# Patient Record
Sex: Female | Born: 1965 | Race: White | Hispanic: No | Marital: Married | Smoking: Never smoker
Health system: Southern US, Community
[De-identification: ages and names within clinical notes are randomized; demographics above are authoritative.]

## PROBLEM LIST (undated history)

## (undated) DIAGNOSIS — Z9289 Personal history of other medical treatment: Secondary | ICD-10-CM

## (undated) DIAGNOSIS — Q652 Congenital dislocation of hip, unspecified: Secondary | ICD-10-CM

## (undated) DIAGNOSIS — F32A Depression, unspecified: Secondary | ICD-10-CM

## (undated) DIAGNOSIS — G5600 Carpal tunnel syndrome, unspecified upper limb: Secondary | ICD-10-CM

## (undated) DIAGNOSIS — M810 Age-related osteoporosis without current pathological fracture: Secondary | ICD-10-CM

## (undated) DIAGNOSIS — T8859XA Other complications of anesthesia, initial encounter: Secondary | ICD-10-CM

## (undated) DIAGNOSIS — M549 Dorsalgia, unspecified: Secondary | ICD-10-CM

## (undated) DIAGNOSIS — G8929 Other chronic pain: Secondary | ICD-10-CM

## (undated) DIAGNOSIS — R112 Nausea with vomiting, unspecified: Secondary | ICD-10-CM

## (undated) DIAGNOSIS — F419 Anxiety disorder, unspecified: Secondary | ICD-10-CM

## (undated) DIAGNOSIS — I10 Essential (primary) hypertension: Secondary | ICD-10-CM

## (undated) DIAGNOSIS — Z9889 Other specified postprocedural states: Secondary | ICD-10-CM

## (undated) DIAGNOSIS — M1990 Unspecified osteoarthritis, unspecified site: Secondary | ICD-10-CM

## (undated) DIAGNOSIS — T4145XA Adverse effect of unspecified anesthetic, initial encounter: Secondary | ICD-10-CM

## (undated) DIAGNOSIS — M2550 Pain in unspecified joint: Secondary | ICD-10-CM

## (undated) DIAGNOSIS — F329 Major depressive disorder, single episode, unspecified: Secondary | ICD-10-CM

## (undated) DIAGNOSIS — R3915 Urgency of urination: Secondary | ICD-10-CM

## (undated) DIAGNOSIS — M2540 Effusion, unspecified joint: Secondary | ICD-10-CM

## (undated) HISTORY — PX: JOINT REPLACEMENT: SHX530

## (undated) HISTORY — PX: OTHER SURGICAL HISTORY: SHX169

## (undated) HISTORY — PX: WISDOM TOOTH EXTRACTION: SHX21

## (undated) HISTORY — PX: EYE SURGERY: SHX253

## (undated) HISTORY — DX: Unspecified osteoarthritis, unspecified site: M19.90

## (undated) HISTORY — PX: HIP SURGERY: SHX245

## (undated) HISTORY — DX: Age-related osteoporosis without current pathological fracture: M81.0

## (undated) HISTORY — PX: CARPAL TUNNEL RELEASE: SHX101

---

## 2006-03-04 ENCOUNTER — Inpatient Hospital Stay (HOSPITAL_COMMUNITY): Admission: RE | Admit: 2006-03-04 | Discharge: 2006-03-08 | Payer: Self-pay | Admitting: Orthopedic Surgery

## 2006-04-06 ENCOUNTER — Ambulatory Visit (HOSPITAL_BASED_OUTPATIENT_CLINIC_OR_DEPARTMENT_OTHER): Admission: RE | Admit: 2006-04-06 | Discharge: 2006-04-06 | Payer: Self-pay | Admitting: Orthopedic Surgery

## 2006-10-12 ENCOUNTER — Inpatient Hospital Stay (HOSPITAL_COMMUNITY): Admission: RE | Admit: 2006-10-12 | Discharge: 2006-10-16 | Payer: Self-pay | Admitting: Orthopedic Surgery

## 2010-08-06 NOTE — Op Note (Signed)
NAMEMIRRIAM, VADALA     ACCOUNT NO.:  [id number]   MEDICAL RECORD NO.:  [id number]          PATIENT TYPE:  INP   LOCATION:  0007                         FACILITY:  [facility]   PHYSICIAN:  Ollen Gross, M.D.    DATE OF BIRTH:  1965/07/22   DATE OF PROCEDURE:  10/12/2006  DATE OF DISCHARGE:                               OPERATIVE REPORT   PREOPERATIVE DIAGNOSIS:  Osteoarthritis right knee.   POSTOPERATIVE DIAGNOSIS:  Osteoarthritis right knee.   PROCEDURE:  Right total knee arthroplasty.   SURGEON:  Dr. Lequita Halt   ASSISTANT:  Avel Peace PA-C   ANESTHESIA:  General with postop Marcaine pain pump.   ESTIMATED BLOOD LOSS:  Minimal.   DRAINS:  None.   TOURNIQUET TIME:  52 minutes at 300 mmHg.   COMPLICATIONS:  None.   CLINICAL NOTE:  The patient is a 45 year old female who has end-stage  arthritis of the right knee with progressively worsening pain and  dysfunction.  She has had a previous successful left total knee  arthroplasty and presents now for right total knee arthroplasty.   PROCEDURE IN DETAIL:  After successful initiation of general anesthetic  a tourniquet placed high on the right thigh.  Right lower extremity  prepped and draped in usual sterile fashion.  Extremities wrapped in  Esmarch, knee flexed, tourniquet inflated to 300 mmHg.  Midline incision  made with 10 blade through subcutaneous tissue to the level of the  extensor mechanism.  Fresh blade is used make a medial parapatellar  arthrotomy.  Soft tissue of the proximal medial tibia is subperiosteally  elevated to the joint line with knife into the semimembranosus bursa  with a Cobb elevator.  Soft tissue of the proximal lateral tibia is  elevated with attention being paid to avoid patellar tendon on tibial  tubercle.  Patella subluxed laterally, knee flexed 90 degrees, ACL and  PCL removed.  Drill was used to create a starting hole in the distal  femur and canal was thoroughly irrigated.  5 degrees  right valgus  alignment guide was placed referencing off the posterior condyles,  rotations marked and the block pinned to remove 12 mm of the distal  femur.  Distal femoral resection made with an oscillating saw.  I took  12 mm off because she had a large flexion contracture.   Tibia was then subluxed forward and the menisci removed.  The  extramedullary tibial alignment guide is placed referencing proximally  at the medial aspect of the tibial tubercle and distally along the  second metatarsal axis and tibial crest.  I took 8 mm off the lateral  side which was less deficient.  There is no cartilage left at all on  either side thus I did not take 10 mm.  The resection is made with an  oscillating saw.  The osteophytes were then removed from the rim of the  tibia.  The size 2.5 would be the most appropriate tibial component.   There was enough room now to place the femoral sizing block.  2.5 is  most appropriate femoral size.  Rotations marked off the epicondylar  axis.  A 2.5 cutting blocks  placed and the anterior-posterior chamfer  cuts are made.  The tibial preparation is then completed with the  modular drill and keel punch for size 2.5.   2.5 mobile bearing tibial trial, 2.5 posterior stabilized femoral trial  and 10 mm posterior stabilized rotating platform insert trial are  placed.  With the 10 full extensions achieved with excellent varus and  valgus balance throughout full range of motion.  The patella was  everted, thickness measured to be 25 mm.  Freehand resection is taken  down to 15 mm, 38 template is placed, lug holes were drilled, trial  patella is placed and it tracks normally.   The trials removed and the cut bone surfaces are prepared with pulsatile  lavage.  Cements mixed.  Once ready for implantation, size 2.5 mobile  bearing tibial tray, size 2.5 posterior stabilized femur and 38 patella  are cemented in place.  Patella was held with the clamp.  The trial 10-   mm inserts placed, knee held in full extension, all extruded cement  removed.  Once cement fully hardened then the permanent 10 mm posterior  stabilized rotating platform insert is placed into the tibial tray.  Wound was copiously irrigated saline solution and extensor mechanism  closed with interrupted #1 PDS.  Prior to that we let the tourniquet  down and injected the FloSeal.  There was minimal bleeding after the  FloSeal.  All this was stopped with electrocautery.  Once we closed  arthrotomy in flexion against gravity 110 degrees at which point her  calf and posterior thigh were touching.  Subcu is then closed with 2-0  Vicryl subcuticular running 4-0 Monocryl.  Catheter for Marcaine pain  pump is placed and the pump initiated.  Steri-Strips and a bulky sterile  dressing applied and she is awakened and transferred to recovery in  stable condition.      Ollen Gross, M.D.  Electronically Signed     FA/MEDQ  D:  10/12/2006  T:  10/13/2006  Job:  [id number]

## 2010-08-06 NOTE — H&P (Signed)
NAMEDERHONDA, EASTLICK     ACCOUNT NO.:  [id number]   MEDICAL RECORD NO.:  [id number]        PATIENT TYPE:  LINP   LOCATION:                               FACILITY:  [facility]   PHYSICIAN:  Ollen Gross, M.D.    DATE OF BIRTH:  [birth year]   DATE OF ADMISSION:  10/12/2006  DATE OF DISCHARGE:                              HISTORY & PHYSICAL   DATE OF OFFICE VISIT HISTORY AND PHYSICAL:  October 09, 2006.   CHIEF COMPLAINT:  Right knee pain.   HISTORY OF PRESENT ILLNESS:  The patient is a 45 year old female, well-  known to Dr. Homero Fellers Aluisio.  She has previously undergone a left total  knee back in December 2007.  She has done quite well with her left knee.  Now she presents to have the opposite knee done.  She is felt to be an  excellent candidate.  Risks and benefits were discussed.  She has been  cleared medically by her physician, Dr. Barrett Henle, and she is  subsequently admitted to the hospital.   ALLERGIES:  NO KNOWN DRUG ALLERGIES.   CURRENT MEDICATIONS:  1. Prozac 20 mg daily.  2. Diclofenac, stopped prior to surgery.  3. Vicodin.  4. Oxycodone.   PAST MEDICAL HISTORY:  1. History of varicose veins.  2. History of venous insufficiency.  3. History of venous stasis ulcers, improved.  4. 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  5. Premenstrual dysphoric syndrome.   PAST SURGICAL HISTORY:  1. Bilateral total hip replacements done in August and November 1994.  2. Right knee cartilage surgery.  3. Laser treatments for varicose veins.  4. Percutaneous vein procedure.  5. Left total knee, December 2007.   FAMILY HISTORY:  Parkinson's.  Mother living at age 30 with a history of  elevated cholesterol, breast cancer and MS.  She has 2 uncles, both with  prostate cancer, aunt with pancreatic cancer, father living, in good  health.   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  Currently lives in the [place].  She does not  have anyone [place] and will need to go into some inpatient  rehab because she will be leaving directly from rehab, going back to the  [place]   REVIEW OF SYSTEMS:  GENERAL:  No fevers, chills or night sweats.  NEUROLOGIC:  No seizures, syncope or paralysis.  RESPIRATORY:  No  shortness, productive cough or hemoptysis.  CARDIOVASCULAR:  No chest  pain, angina or orthopnea.  GI:  No nausea, vomiting, diarrhea or  constipation.  GU:  No dysuria or hematuria or discharge.  MUSCULOSKELETAL:  Right knee.   PHYSICAL EXAMINATION:  VITAL SIGNS:  Pulse 64, respirations 12, blood  pressure 138/78.  GENERAL:  A 45 year old white female, well-nourished and well-developed,  in no acute distress.  She is alert, oriented and cooperative, very  pleasant, slightly overweight, excellent historian.  HEENT:  Normocephalic, atraumatic.  Pupils are round and reactive.  Oropharynx clear.  EOMs intact.  NECK:  Supple.  CHEST:  Clear, anterior and  posterior chest walls.  No rhonchi, rales or  wheezing.  HEART:  Regular rate and rhythm.  No murmur.  S1 and S2 noted.  ABDOMEN:  Soft, slightly round.  Bowel sounds present.  RECTAL, BREAST AND GENITALIA:  Not done, not pertinent to present  illness.  EXTREMITIES:  Right knee:  Motor function is intact.  No effusion.  Moderate crepitus is noted on passive range of motion.   IMPRESSION:  Osteoarthritis of right knee.   PLAN:  The patient is admitted to [facility] to undergo a  right total knee replacement arthroplasty.  Surgery will be performed by  Dr. Ollen Gross.      Alexzandrew L. Perkins, P.A.C.      Ollen Gross, M.D.  Electronically Signed    ALP/MEDQ  D:  10/11/2006  T:  10/12/2006  Job:  [id number]   cc:   [address], [address] [place] [id number] Barrett Henle MD

## 2010-08-06 NOTE — Discharge Summary (Signed)
Carmen Strickland, Carmen Strickland     ACCOUNT NO.:  [id number]   MEDICAL RECORD NO.:  [id number]          PATIENT TYPE:  INP   LOCATION:  1609                         FACILITY:  [facility]   PHYSICIAN:  Ollen Gross, M.D.    DATE OF BIRTH:  [birth year]   DATE OF ADMISSION:  10/12/2006  DATE OF DISCHARGE:  10/16/2006                         DISCHARGE SUMMARY - REFERRING   ADMISSION DIAGNOSES:  1. Osteoarthritis right knee.  2. History of varicose veins.  3. History of venous insufficiency.  4. History of venostasis ulcers.  5. 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  6. Premenstrual dysphoric syndrome.   DISCHARGE DIAGNOSES:  1. Osteoarthritis right knee, status post right total knee replacement      arthroplasty.  2. History of varicose veins.  3. History of venous insufficiency.  4. History of venostasis ulcers.  5. 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  6. Premenstrual dysphoric syndrome.   PROCEDURE:  Right total knee.   SURGEON:  Ollen Gross, M.D.   ASSISTANT:  Alexzandrew L. Perkins, P.A.C.   ANESTHESIA:  General anesthesia.   CONSULTATIONS:  None.   BRIEF HISTORY:  Patient is a 45 year old female with end-stage  osteoarthritis of the right knee, progressive worsening pain,  dysfunction, successful left total knee, now presents for a right total  knee.   LABORATORY DATA:  Preoperative CBC showed a hemoglobin 12.6, hematocrit  37.2, white cell count 6.  Postoperative hemoglobin 11.2, drifted down  to 9.7 and 27.8.  PT/PTT preoperatively 13.5 and 29, respectively.  INR  1.  Serial protimes followed.  Last noted PT/INR 20.2 and 1.7.  Chem-  panel on admission all within normal limits.  Serial BMETs followed.  Electrolytes remained within normal limits.  Preoperative UA negative.  Blood group type O positive.   EKG September 29, 2006, sinus rhythm.  Interpretation made without knowledge  of the patient's sex, within normal limits,  confirmed.  Unable to read  signature.   HOSPITAL COURSE:  Patient admitted to [facility], tolerated  procedure well, later transferred to the recovery room and orthopedic  floor.  Started on PCA and p.o. analgesic pain control following  surgery.  She did pretty well on the evening of surgery, had a fair  amount of pain and increased from low dose to high dose PCA.  She was a  little drowsy through the night but was doing better, had better pain  control on the morning of day #1, was tolerating a CPM, started getting  up with therapy, had decent urinary output.  Hemoglobin was 11.2,  reduced her fluids.  Started getting up with PT.  By day #2, she was  doing a little bit better.  Pain was under better control.  Dressing  changed.  Incision looked good.  Got rid of the PCA and fluids.  It was  noted that patient was from the [place] and was unable to go home  with family and would require some skilled facility.  Discharge planning  was consulted to make arrangements for this.  She continued to receive  therapy throughout the hospital course.  By day #3, she was up moving,  much better pain control, weaned over to p.o. medications.  Hemoglobin  was 9.7 but stable.  She was asymptomatic, progressing with therapy.  On  the following day of October 16, 2006, it was noted that she had a bed  available.  Bed was found at [facility].  Patient was in agreement  with this.  Arrangements were made and she was transferred at that time.   DISCHARGE PLAN:  Patient transferred over to [facility] on October 16, 2006.   DISCHARGE DIAGNOSES:  Please see above.   DISCHARGE MEDICATIONS:  Current medications include  1. Coumadin protocol, please titrate the Coumadin level for target INR      between 2 and 3. She needs to be on Coumadin for three weeks from      date of surgery of October 12, 2006.  2. Colace 100 mg p.o. b.i.d.  3. Prozac 20 mg daily.  4. OxyContin 10 mg  p.o. b.i.d.  5. Nu-Iron 150 mg p.o. daily.  6. Percocet 5 mg one or two every four to six hours as needed for      pain.  7. Tylenol 325 one or two every four to six hours as needed for mild      pain, temperature or headache.  8. Reglan 10 p.o. q.8h. p.r.n. nausea.  9. Robaxin 500 mg p.o. q.6h. p.r.n. spasm.  10.Restoril 15-30 mg p.o. nightly sleep.  11.Senokot S tablets nightly p.r.n.  12.She will also need one dose of Lovenox 40 mg one time on Saturday,      October 17, 2006.  Please note, she received a dose of Lovenox on      Friday, October 16, 2006, the day of transfer but she will just need      one more at [facility] on Saturday, October 17, 2006, and then      discontinue the Lovenox.   DIET:  As tolerated.   ACTIVITY:  She is weightbearing as tolerated to the right lower  extremity.  Gait training, ambulation and ADLs.  Total knee protocol.  Daily dressing change.  She may start showering, however, do not  submerge the incision under water.  Knee immobilizer when walking up  until the point she can do straight leg raises.  Once she can do  straight leg raises, she can discontinue the knee immobilizer.   DISPOSITION:  [facility].   FOLLOW UP:  Two weeks from surgery.  Please context the office at [phone]  [phone] to arrange appointment time and transfer of this patient.   CONDITION ON DISCHARGE:  Improved.      Alexzandrew L. Perkins, P.A.C.      Ollen Gross, M.D.  Electronically Signed    ALP/MEDQ  D:  10/16/2006  T:  10/16/2006  Job:  [id number]   cc:   Barrett Henle, M.D.  [address]  [address]  [place], [place] [address]

## 2010-08-09 NOTE — Discharge Summary (Signed)
[name], [name]     ACCOUNT NO.:  [id number]   MEDICAL RECORD NO.:  [id number]          PATIENT TYPE:  INP   LOCATION:  1511                         FACILITY:  [facility]   PHYSICIAN:  Carmen Strickland, M.D.    DATE OF BIRTH:  06-Jun-1965   DATE OF ADMISSION:  03/04/2006  DATE OF DISCHARGE:  03/08/2006                               DISCHARGE SUMMARY   ADMISSION DIAGNOSES:  1. Osteoarthritis, left knee.  2. Varicose veins.  3. Venous insufficiency.  4. Venous stasis ulcers.   DISCHARGE DIAGNOSES:  1. Osteoarthritis, left knee, status post left total knee      arthroplasty.  2. Acute blood loss anemia, did not require transfusion.  3. Varicose veins.  4. Venous insufficiency.  5. Venous stasis ulcers.   PROCEDURE:  On March 04, 2006, left total knee.   SURGEON:  Carmen Strickland, M.D.   ASSISTANT:  Carmen Strickland, P.A.-C.   ANESTHESIA:  General with postoperative Marcaine pain pump.   CONSULTS:  None.   BRIEF HISTORY:  Carmen Strickland is a 45 year old female with end-stage arthritis  of both knees, left more symptomatic than right, now presents for a left  total knee arthroplasty.   LABORATORY DATA:  Preop CBC showed a hemoglobin of 13.6, hematocrit  40.6, white cell count of 5.5.  Serial CBCs were followed, hemoglobin  dropped down to 10.3.  Last noted H&H 9.7 and 28.1.  PT/PTT on admission  14 and 35, respectively.  INR 1.1.  Coumadin protocol:  Serial pro times  followed.  Last noted PT/INR 19.8 and 1.6.  Chem panel on admission,  slightly elevated CO2 of 33.  Remaining chem panel within normal limits.  Serial BMETs are followed.  Electrolytes remained within normal limits.  Urine pregnancy test negative preoperatively.  Preop UA:  Moderate  hemoglobin, 3-6 red cells, otherwise negative, rare bacteria.  Blood  group type O+.   Two view chest, March 06, 2006, no active cardiopulmonary disease.   EKG:  On February 09, 2006, normal sinus rhythm, normal EKG  confirmed.  Unable to read signature.   HOSPITAL COURSE:  Patient was admitted to [facility],  tolerated the procedure well, and was later transferred to the recovery  room.  Started on PCA and p.o. analgesics for pain control following  surgery.  Given 24 hours of postop IV antibiotics.  Started getting up  out of bed and the following day had excellent urinary output.  Hemovac  drain placed at the time of surgery was pulled.  Started weaning over to  p.o. meds.  Had a fairly good night after surgery.   By day #2, she was doing a little bit better, a little more comfortable,  a little less pain.  The dressing was changed.  The incision was  excellent.  From the therapy standpoint, she started getting up and  moving around a little bit better.  She walked about 60 feet.  She was  weaned over to p.o. meds, and PCO was discontinued.  Did have a fair  amount of pain after getting up on day #2.  Doing a little bit better by  day #3.  Needed a little bit more ambulation and by day #4 of March 08, 2006, she was up walking at 85 feet with supervision.  She received  one dose of Lovenox for coverage, and then she was discharged home after  therapy.   DISCHARGE PLAN:  1. Patient was discharged home on March 08, 2006.  2. Discharge diagnoses:  Please see above.  3. Discharge meds:  OxyIR, Robaxin, and Coumadin.  4. Followup:  Two weeks.  5. Activity:  Weightbearing as tolerated, left lower extremity.  Home      health PT, home health nursing, total knee protocol.   DISPOSITION:  Home.   CONDITION ON DISCHARGE:  Improved.      Carmen Strickland, P.A.      Carmen Strickland, M.D.  Electronically Signed    ALP/MEDQ  D:  04/09/2006  T:  04/09/2006  Job:  [id number]   cc:   Dr. Waylan Rocher   Dr. Barrett Henle

## 2010-08-09 NOTE — H&P (Signed)
Carmen Strickland, Carmen Strickland     ACCOUNT NO.:  [id number]   MEDICAL RECORD NO.:  [id number]        PATIENT TYPE:  LINP   LOCATION:  1511                         FACILITY:  [facility]   PHYSICIAN:  Ollen Gross, M.D.         DATE OF BIRTH:   DATE OF ADMISSION:  03/04/2006  DATE OF DISCHARGE:                              HISTORY & PHYSICAL   CHIEF COMPLAINT:  Left knee pain.   HISTORY OF PRESENT ILLNESS:  The patient is a 45 year old female who has  been seen as a new patient by Dr. Ollen Gross, for ongoing bilateral  knee pain.  The left knee is more problematic and symptomatic than the  right.  It has been ongoing for quite some time now.  She resides in the  [place] but has family here [place].  She has had multiple  surgeries in the past, including bilateral total hip replacements.  Both  of them were done in 1994.  She has also had right knee surgery for  cartilage.  Unfortunately she has tricompartmental arthritis and  degenerative changes in the right and the left knee.  Again, the left  knee is more symptomatic and problematic at this point.  She has been  told she would need knee replacements, [place] near [place].  She was  recommended and seen by Dr. Lequita Halt.  X-rays are reviewed, and it was  felt that she would benefit from undergoing knee replacements.  The  risks and benefits have been discussed, and she elects to proceed with  surgery.   ALLERGIES:  No known drug allergies.   CURRENT MEDICATIONS:  1. Prozac 20 mg daily.  2. Diclofenac, not prior to surgery.  3. Vicodin.  4. Occasional oxycodone for severe pain.   PAST MEDICAL HISTORY:  1. Varicose veins.  2. Venous insufficiency.  3. Venous stasis ulcers.  4. Congenital hip dysplasia, resulting in multiple surgeries and      eventually hip replacements.   PAST SURGICAL HISTORY:  1. Bilateral total hip replacements done in  August and November 1994.  2. Right knee surgery, secondary to cartilage.  3. Laser treatments for varicose veins.  4. Also had a percutaneous vein procedure.   FAMILY HISTORY:  Parkinson's.  Mother's history:  Living at age 38, with  elevated cholesterol, breast cancer and MS.  She has two uncles, both  with prostate cancer.  An aunt with pancreatic cancer.  Her father is  living at age 86, in good health.   SOCIAL HISTORY:  She is married.  Works as an Print production planner.  One to  two glasses of alcohol daily.  Currently lives in the [place].   REVIEW OF SYSTEMS:  GENERAL:  No fevers, chills or night sweats.  NEUROLOGIC:  No seizures, syncope or paralysis.  RESPIRATORY:  No  shortness of breath, productive cough or hemoptysis.  CARDIOVASCULAR:  No chest pain, no murmurs.  GI:  No nausea, vomiting, diarrhea or  constipation.  GENITOURINARY:  No dysuria, hematuria or discharge.  MUSCULOSKELETAL:  Left knee.  PHYSICAL EXAMINATION:  VITAL SIGNS:  Pulse 64, respirations 12, blood  pressure 128/74.  GENERAL:  A 45 year old white female, well-developed and well-nourished,  in no acute distress, slightly overweight.  She is accompanied by her  husband.  She is alert, oriented, cooperative and pleasant.  HEENT:  Normocephalic and atraumatic.  Pupils equal, round, reactive.  Oropharynx clear.  EOMs intact.  NECK:  Supple.  CHEST:  Clear.  Anterior and posterior chest wall unremarkable.  No  rales or wheezing appreciated.  HEART:  Regular rate and rhythm.  No murmurs.  S1 and S2 noted.  ABDOMEN:  Soft, slightly protuberant abdomen, round, bowel sounds  present.  RECTAL/BREASTS/GENITALIA:  Not done, not pertinent to the present  illness.  EXTREMITIES:  Left knee:  Moderate crepitus is noted.  Mild effusion.  Tender medial joint line.  She does have a small 1 cm x 1 cm superficial  shallow ulceration on the right medial ankle.  The wound is dressed.   Electrocardiogram:  Sent with the  patient showing a normal sinus rhythm.   IMPRESSION:  1. Osteoarthritis, left knee.  2. Varicose veins.  3. Venous insufficiency.  4. Venous stasis ulcers.   PLAN:  The patient will be admitted to [facility] and will  undergo a left total knee arthroplasty.  She has been seen  preoperatively by her medical physician, Dr. Barrett Henle, back in [place], who felt that she is medically stable for upcoming surgery.      Alexzandrew L. Julien Girt, P.A.      Ollen Gross, M.D.  Electronically Signed    ALP/MEDQ  D:  03/03/2006  T:  03/04/2006  Job:  [id number]   cc:   Dawayne Cirri, M.D.  [address] [place]  [place], [place] [address]   Barrett Henle, Dr.  [address]  [place], [place] [address]

## 2010-08-09 NOTE — Op Note (Signed)
Carmen Strickland, Carmen Strickland     ACCOUNT NO.:  [id number]   MEDICAL RECORD NO.:  [id number]          PATIENT TYPE:  INP   LOCATION:  0004                         FACILITY:  [facility]   PHYSICIAN:  Ollen Gross, M.D.    DATE OF BIRTH:  Apr 29, 1965   DATE OF PROCEDURE:  03/04/2006  DATE OF DISCHARGE:                               OPERATIVE REPORT   PREOPERATIVE DIAGNOSIS:  Osteoarthritis, left knee.   POSTOPERATIVE DIAGNOSIS:  Osteoarthritis, left knee.   PROCEDURE:  Left total knee arthroplasty.   SURGEON:  Dr. Lequita Halt   ASSISTANT:  Avel Peace, PA-C   ANESTHESIA:  General with postop Marcaine pain pump.   ESTIMATED BLOOD LOSS:  Minimal.   DRAIN:  Hemovac x1.   TOURNIQUET TIME:  63 minutes at 300 mmHg.   COMPLICATIONS:  None.   CONDITION:  Stable to recovery.   BRIEF CLINICAL NOTE:  Carmen Strickland is a 45 year old female who has end-stage  osteoarthritis of both knees, left more symptomatic than the right.  She  presents now for left total knee arthroplasty.   PROCEDURE IN DETAIL:  After the successful administration of general  anesthetic, a tourniquet is placed high on her left thigh, left lower  extremity prepped and draped in the usual sterile fashion.  Extremity is  wrapped in Esmarch, knee flexed, tourniquet inflated to 350 mmHg.  A  midline incision is made with a 10 blade through subcutaneous tissue to  the level of the extensor mechanism.  A fresh blade is used to make a  medial parapatellar arthrotomy.  Soft tissue over the proximal and  medial tibia is subperiosteally elevated to the joint line with a knife  and into the semimembranous bursa with a Cobb elevator.  Soft tissue  laterally is elevated with attention being paid to avoiding the patellar  tendon on tibial tubercle.  The patella is subluxed laterally, knee  flexed 90 degrees, ACL and PCL removed.  Drill was used to create a  starting hole in the distal femur; canal is thoroughly irrigated.  A 5-  degree  left valgus alignment guide is placed and referencing off the  posterior condyles, rotation is marked and the block pinned to remove 10  mm off the distal femur.  Distal femoral resection is made with an  oscillating saw.  The sizing block is placed, and size 2.5 is most  appropriate.  Rotation is marked at the epicondylar axis.  Size 2.5  cutting block is placed, and the anterior, posterior, and chamfer cuts  are made.   Tibia is subluxed forward, and the menisci are removed.  Extramedullary  tibial alignment guide is placed referencing proximally at the medial  aspect of the tibial tubercle and distally along the second metatarsal  axis and tibial crest.  Block is pinned to remove 10 mm off the  nondeficient lateral side.  Tibial resection is made with an oscillating  saw.  Size 3 5 is the most appropriate, and the proximal tibia is  prepared with the modular drill and keel punch for a size 3.  Femoral  preparation is completed with the intercondylar cut.   Size 3 mobile bearing tibial trial  with size 2.5 posterior stabilized  femoral trial and a 10 mm posterior stabilized rotating platform insert  trial are placed.  With the 10, full extension is achieved with  excellent varus and valgus balance throughout full range of motion.  The  patella is then everted and thickness measured to be 22 mm.  Free-hand  resection is taken to 13 mm, 35 template is placed, and it tracks  normally.  Osteophytes are removed off the posterior femur with the  trial in place.  All trials are removed and the cut bone surfaces  prepared with pulsatile lavage.  Cement is mixed and once ready for  implantation, the size 3 mobile bearing tibial tray, size 2.5 posterior  stabilized femur, and 35 patella are cemented into place, and the  patella is held with a clamp.  Trial 10 mm insert is placed, knee held  in full extension, all extruded cement removed.  Once the cement is  fully hardened, then the permanent 10  mm posterior stabilized rotating  platform insert is placed into the tibial tray.  The wound is copiously  irrigated with saline solution and the extensor mechanism closed over a  Hemovac drain with interrupted #1 PDS.  Flexion against gravity is 110  degrees at which point her calf is hitting her posterior thigh.  The  tourniquet is released for a total time of 63 minutes.  Subcu closed  with interrupted 2-0 Vicryl, subcuticular running 4-0 Monocryl.  Catheter for the Marcaine pain pump is placed, and the pump is  initiated.  Steri-Strips and a bulky sterile dressing are applied.  Hemovac is hooked to suction.  She is placed into a knee immobilizer,  awakened, and transported to recovery in stable condition.      Ollen Gross, M.D.  Electronically Signed     FA/MEDQ  D:  03/04/2006  T:  03/04/2006  Job:  [id number]

## 2010-08-09 NOTE — Op Note (Signed)
NAMERAQUELLE, PIETRO     ACCOUNT NO.:  [id number]   MEDICAL RECORD NO.:  [id number]          PATIENT TYPE:  AMB   LOCATION:  [facility]                         FACILITY:  [facility]   PHYSICIAN:  Ollen Gross, M.D.    DATE OF BIRTH:  [birth year]   DATE OF PROCEDURE:  04/06/2006  DATE OF DISCHARGE:                               OPERATIVE REPORT   PREOPERATIVE DIAGNOSIS:  Arthrofibrosis, left knee.   POSTOPERATIVE DIAGNOSIS:  Arthrofibrosis, left knee.   PROCEDURE:  Left knee closed manipulation.   SURGEON:  Dr. Lequita Halt   ASSISTANT:  None.   ANESTHESIA:  General.   PREMANIPULATION RANGE OF MOTION:  0-60.   POSTMANIPULATION RANGE OF MOTION:  0-115.   COMPLICATIONS:  None.   CONDITION:  Stable to recovery.   BRIEF CLINICAL NOTE:  Kriste Basque is a 45 year old female, had a left total  knee arthroplasty performed on March 04, 2006.  She has been doing  well with the exception of regaining motion in therapy.  She had been  stuck at approximately 60 degrees.  She presents now for closed  manipulation.   PROCEDURE IN DETAIL:  After the successful administration of general  anesthetic, exam under anesthesia performed, and she had range 0-60.  I  then placed my chest on the proximal tibia and gently flexed the knee.  I was able to easily get her flexed to about 115 degrees at which point  the posterior calf was in the posterior thigh.  We also maintained full  extension.  The patella mobility was intact.  She is subsequently  awakened and transported to recovery in stable condition.      Ollen Gross, M.D.  Electronically Signed     FA/MEDQ  D:  04/06/2006  T:  04/06/2006  Job:  [id number]

## 2011-01-06 LAB — URINALYSIS, ROUTINE W REFLEX MICROSCOPIC
Nitrite: NEGATIVE
Protein, ur: NEGATIVE
Specific Gravity, Urine: 1.015
Urobilinogen, UA: 0.2

## 2011-01-06 LAB — PREGNANCY, URINE: Preg Test, Ur: NEGATIVE

## 2011-01-06 LAB — CBC
HCT: 27.8 — ABNORMAL LOW
HCT: 30.7 — ABNORMAL LOW
HCT: 37.2
Hemoglobin: 10.6 — ABNORMAL LOW
Hemoglobin: 11.2 — ABNORMAL LOW
Hemoglobin: 12.6
MCHC: 34
MCV: 82.9
MCV: 83.2
MCV: 83.3
MCV: 83.4
Platelets: 233
Platelets: 242
RBC: 3.97
RBC: 4.47
RDW: 13.4
RDW: 14.1 — ABNORMAL HIGH
WBC: 6

## 2011-01-06 LAB — BASIC METABOLIC PANEL
BUN: <1 — ABNORMAL LOW
CO2: 27
CO2: 29
Chloride: 103
Glucose, Bld: 105 — ABNORMAL HIGH
Glucose, Bld: 138 — ABNORMAL HIGH
Potassium: 3.9
Potassium: 4.2
Sodium: 137

## 2011-01-06 LAB — PROTIME-INR
INR: 1.5
INR: 1.7 — ABNORMAL HIGH
Prothrombin Time: 20.2 — ABNORMAL HIGH

## 2011-01-06 LAB — TYPE AND SCREEN: ABO/RH(D): O POS

## 2011-01-06 LAB — COMPREHENSIVE METABOLIC PANEL
AST: 19
Albumin: 3.8
Calcium: 9.4
Creatinine, Ser: 0.65
GFR calc non Af Amer: >60

## 2011-01-06 LAB — APTT: aPTT: 29

## 2011-11-20 ENCOUNTER — Other Ambulatory Visit (HOSPITAL_COMMUNITY)
Admission: RE | Admit: 2011-11-20 | Discharge: 2011-11-20 | Disposition: A | Discharge status: Home / Self Care | Payer: 59 | Source: Ambulatory Visit | Attending: Family Medicine | Admitting: Family Medicine

## 2011-11-20 DIAGNOSIS — Z124 Encounter for screening for malignant neoplasm of cervix: Secondary | ICD-10-CM | POA: Insufficient documentation

## 2012-01-08 ENCOUNTER — Ambulatory Visit (INDEPENDENT_AMBULATORY_CARE_PROVIDER_SITE_OTHER): Payer: 59 | Admitting: Family Medicine

## 2012-01-08 VITALS — BP 174/98 | HR 67 | Temp 98.3°F | Resp 16 | Ht 62.0 in | Wt 205.0 lb

## 2012-01-08 DIAGNOSIS — K1379 Other lesions of oral mucosa: Secondary | ICD-10-CM

## 2012-01-08 DIAGNOSIS — S01502A Unspecified open wound of oral cavity, initial encounter: Secondary | ICD-10-CM

## 2012-01-08 DIAGNOSIS — G501 Atypical facial pain: Secondary | ICD-10-CM

## 2012-01-08 DIAGNOSIS — S60519A Abrasion of unspecified hand, initial encounter: Secondary | ICD-10-CM

## 2012-01-08 DIAGNOSIS — S0180XA Unspecified open wound of other part of head, initial encounter: Secondary | ICD-10-CM

## 2012-01-08 DIAGNOSIS — S0993XA Unspecified injury of face, initial encounter: Secondary | ICD-10-CM

## 2012-01-08 MED ORDER — AMOXICILLIN 500 MG PO CAPS: 500.0000 mg | ORAL_CAPSULE | Freq: Three times a day (TID) | ORAL | Status: DC

## 2012-01-08 MED ORDER — HYDROCODONE-ACETAMINOPHEN 5-325 MG PO TABS: 1.0000 | ORAL_TABLET | Freq: Four times a day (QID) | ORAL | Status: DC | PRN

## 2012-01-08 NOTE — Progress Notes (Signed)
   Patient ID: Carmen Strickland MRN: [id number], DOB: 1965-09-30, 46 y.o. Date of Encounter: 01/08/2012, 9:47 PM   PROCEDURE NOTE: Verbal consent obtained. Sterile technique employed. Numbing: Anesthesia obtained with 2% plain lidocaine 2 cc for local anesthesia for buccal mucosa and 1 cc for lower lip laceration.    Cleansed with soap and water. Irrigated.  Wound explored no foreign bodies.   Buccal mucosa wound repaired with # 4 simple interrupted sutures loosely 5-0 Ethilon and lower lip laceration repaired with #1 simple interrupted 5-0 Ethilon.  Hemostasis obtained. Wound cleansed and dressed.  Wound care instructions including precautions covered with patient. Handout given.  Anticipate suture removal in 5 days.  SignedEula Listen, PA-C 01/08/2012 9:47 PM

## 2012-01-08 NOTE — Patient Instructions (Signed)
See head injury handout and information on wound care. Call dentist to be seen tomorrow for your 2 upper teeth.  Recheck your blood pressure when pain controlled - if greater than 140/90 - recheck.   Return to the clinic or go to the nearest emergency room if any of your symptoms worsen or new symptoms occur.

## 2012-01-08 NOTE — Progress Notes (Signed)
Subjective:    Patient ID: Carmen Strickland, female    DOB: October 28, 1965, 46 y.o.   MRN: [id number]  HPI Carmen Strickland is a 46 y.o. female  [place] at home - tripped and fell this evening at 545pm tonight. Fell onto hands (no hand or wrist pain), abrasion on knee, but moving this ok, and onto chin - bounced.   Most sore area - inside of mouth and lower lip. No LOC.   No headache/dizziness/N/V/or vision changes. Able to open and close jaw ok. Wounds inside and outside lip.   No neck pain.  Able to swallow ok.   primary care - Dr. Maurice Small. Physical 1 month ago - had tetanus then.    Review of Systems  HENT: Positive for dental problem (hx of chipped tooth - upper central incisor. ). Negative for drooling, mouth sores, neck pain and neck stiffness.   Respiratory: Negative for shortness of breath.   Gastrointestinal: Negative for nausea.  Musculoskeletal: Negative for joint swelling and arthralgias.  Skin: Positive for wound.  Neurological: Negative for dizziness, speech difficulty, light-headedness and headaches.   As above.     Objective:   Physical Exam  Constitutional: She appears well-developed and well-nourished.  HENT:  Head: Microcephalic. Head is without raccoon's eyes and without Battle's sign.    Right Ear: External ear normal.  Left Ear: External ear normal.  Mouth/Throat:         tmj nontender, able to open/close jaw without difficulty.    Inside of lower lip - 3cm horizontal lac with slight jagged appearance. Hemostatic.  No loose teeth on lower jaw.  L upper central incisor with small chip, possible crack distally - no movement with pressing on this area.    Pulmonary/Chest: Effort normal and breath sounds normal.  Musculoskeletal:       Right wrist: Normal. She exhibits normal range of motion and no bony tenderness.       Left wrist: Normal. She exhibits normal range of motion and no bony tenderness.       Right knee: She  exhibits normal range of motion and no bony tenderness.       Left knee: She exhibits normal range of motion and no bony tenderness.  Skin: Skin is warm and dry.             Assessment & Plan:  Carmen Strickland is a 46 y.o. female 1. Wound, open, face  amoxicillin (AMOXIL) 500 MG capsule  2. Abrasion, hand    3. Tooth injury  HYDROcodone-acetaminophen (NORCO/VICODIN) 5-325 MG per tablet  4. Wound, open, mouth  HYDROcodone-acetaminophen (NORCO/VICODIN) 5-325 MG per tablet, amoxicillin (AMOXIL) 500 MG capsule  5. Pain in mouth  HYDROcodone-acetaminophen (NORCO/VICODIN) 5-325 MG per tablet   Wound - mouth internal lower, below lip, no vermillion extension,  with extension to outer aspect of lower face below mouth. Repaired per procedure note. Covered with amoxicillin for secondary infection prevention.  Repaired loosely inside and outisde repair per procedure note.  Plan to follow up with dentist next day for eval of cracked upper incisors, but no apparent instability on testing on office. rtc precautions and wound care discussed.   Patient Instructions  See head injury handout and information on wound care. Call dentist to be seen tomorrow for your 2 upper teeth.  Recheck your blood pressure when pain controlled - if greater than 140/90 - recheck.   Return to the clinic or go to the nearest emergency room if  any of your symptoms worsen or new symptoms occur.    Marland Kitchen

## 2012-01-13 ENCOUNTER — Ambulatory Visit (INDEPENDENT_AMBULATORY_CARE_PROVIDER_SITE_OTHER): Payer: 59 | Admitting: Family Medicine

## 2012-01-13 VITALS — BP 128/87 | HR 66 | Temp 98.3°F | Resp 18 | Ht 62.0 in | Wt 202.0 lb

## 2012-01-13 DIAGNOSIS — Z4802 Encounter for removal of sutures: Secondary | ICD-10-CM

## 2012-01-13 NOTE — Progress Notes (Signed)
  [facility]:  Office Visit  Chief Complaint:  Chief Complaint  Patient presents with  . Suture / Staple Removal    face    HPI: Carmen Strickland is a 46 y.o. female who complains of  5 day h/o suture placement after falling, busted lip and chin. Lacerations healing well.   Past Medical History  Diagnosis Date  . Arthritis   . Osteoporosis    Past Surgical History  Procedure Date  . Joint replacement     hips/knees   History   Social History  . Marital Status: Unknown    Spouse Name: N/A    Number of Children: N/A  . Years of Education: N/A   Social History Main Topics  . Smoking status: Never Smoker   . Smokeless tobacco: Not on file  . Alcohol Use: Yes  . Drug Use: No  . Sexually Active: Not on file   Other Topics Concern  . Not on file   Social History Narrative  . No narrative on file   Family History  Problem Relation Age of Onset  . Multiple sclerosis Mother    No Known Allergies Prior to Admission medications   Medication Sig Start Date End Date Taking? Authorizing Provider  amoxicillin (AMOXIL) 500 MG capsule Take 1 capsule (500 mg total) by mouth 3 (three) times daily. 01/08/12  Yes Shade Flood, MD  diclofenac (VOLTAREN) 75 MG EC tablet Take 75 mg by mouth 2 (two) times daily.   Yes Historical Provider, MD  FLUoxetine (PROZAC) 40 MG capsule Take 40 mg by mouth daily.   Yes Historical Provider, MD  HYDROcodone-acetaminophen (NORCO/VICODIN) 5-325 MG per tablet Take 1 tablet by mouth every 6 (six) hours as needed for pain. 01/08/12  Yes Shade Flood, MD     ROS: The patient denies fevers, chills, night sweats, unintentional weight loss, chest pain, palpitations, wheezing, dyspnea on exertion, nausea, vomiting, abdominal pain, dysuria, hematuria, melena, numbness, weakness, or tingling.   All other systems have been reviewed and were otherwise negative with the exception of those mentioned in the HPI and as above.     PHYSICAL EXAM: Filed Vitals:   01/13/12 1159  BP: 128/87  Pulse: 66  Temp: 98.3 F (36.8 C)  Resp: 18   Filed Vitals:   01/13/12 1159  Height: 5\' 2"  (1.575 m)  Weight: 202 lb (91.627 kg)   Body mass index is 36.95 kg/(m^2).  General: Alert, no acute distress HEENT:  Normocephalic, atraumatic, oropharynx patent.  Cardiovascular:  Regular rate and rhythm, no rubs murmurs or gallops.  No Carotid bruits, radial pulse intact. No pedal edema.  Respiratory: Clear to auscultation bilaterally.  No wheezes, rales, or rhonchi.  No cyanosis, no use of accessory musculature GI: No organomegaly, abdomen is soft and non-tender, positive bowel sounds.  No masses. Skin: wound clean and healing as expected, no purulent dc Neurologic: Facial musculature symmetric. Psychiatric: Patient is appropriate throughout our interaction. Lymphatic: No cervical lymphadenopathy Musculoskeletal: Gait intact.   LABS:    EKG/XRAY:   Primary read interpreted by Dr[facility] Con[facility] Rolls at [facility].   ASSESSMENT/PLAN: Encounter Diagnosis  Name Primary?  . Visit for suture removal Yes   4 stitches removed without complications Cont with Augmentin Salt water gargles TID after meals as directed Monitor for s/sx infection   Velma Hanna PHUONG, DO 01/13/2012 12:55 PM

## 2012-03-31 ENCOUNTER — Ambulatory Visit: Payer: 59 | Attending: Neurology | Admitting: Physical Therapy

## 2012-03-31 DIAGNOSIS — M4120 Other idiopathic scoliosis, site unspecified: Secondary | ICD-10-CM | POA: Insufficient documentation

## 2012-03-31 DIAGNOSIS — R269 Unspecified abnormalities of gait and mobility: Secondary | ICD-10-CM | POA: Insufficient documentation

## 2012-03-31 DIAGNOSIS — M6281 Muscle weakness (generalized): Secondary | ICD-10-CM | POA: Insufficient documentation

## 2012-03-31 DIAGNOSIS — IMO0001 Reserved for inherently not codable concepts without codable children: Secondary | ICD-10-CM | POA: Insufficient documentation

## 2012-03-31 DIAGNOSIS — Z96659 Presence of unspecified artificial knee joint: Secondary | ICD-10-CM | POA: Insufficient documentation

## 2012-03-31 DIAGNOSIS — Z96649 Presence of unspecified artificial hip joint: Secondary | ICD-10-CM | POA: Insufficient documentation

## 2012-04-05 ENCOUNTER — Ambulatory Visit: Payer: 59 | Admitting: Physical Therapy

## 2012-04-07 ENCOUNTER — Ambulatory Visit: Payer: 59 | Admitting: Physical Therapy

## 2012-04-12 ENCOUNTER — Ambulatory Visit: Payer: 59 | Admitting: Rehabilitative and Restorative Service Providers"

## 2012-04-14 ENCOUNTER — Ambulatory Visit: Payer: 59 | Admitting: Physical Therapy

## 2012-04-19 ENCOUNTER — Ambulatory Visit: Payer: 59 | Admitting: Physical Therapy

## 2012-04-20 ENCOUNTER — Other Ambulatory Visit (HOSPITAL_COMMUNITY): Payer: Self-pay | Admitting: Neurology

## 2012-04-20 ENCOUNTER — Other Ambulatory Visit (HOSPITAL_COMMUNITY): Payer: 59

## 2012-04-20 DIAGNOSIS — R269 Unspecified abnormalities of gait and mobility: Secondary | ICD-10-CM

## 2012-04-21 ENCOUNTER — Ambulatory Visit: Payer: 59 | Admitting: Physical Therapy

## 2012-04-26 ENCOUNTER — Ambulatory Visit: Payer: 59 | Attending: Neurology | Admitting: Physical Therapy

## 2012-04-26 DIAGNOSIS — R269 Unspecified abnormalities of gait and mobility: Secondary | ICD-10-CM | POA: Insufficient documentation

## 2012-04-26 DIAGNOSIS — IMO0001 Reserved for inherently not codable concepts without codable children: Secondary | ICD-10-CM | POA: Insufficient documentation

## 2012-04-26 DIAGNOSIS — Z96649 Presence of unspecified artificial hip joint: Secondary | ICD-10-CM | POA: Insufficient documentation

## 2012-04-26 DIAGNOSIS — M6281 Muscle weakness (generalized): Secondary | ICD-10-CM | POA: Insufficient documentation

## 2012-04-26 DIAGNOSIS — M4120 Other idiopathic scoliosis, site unspecified: Secondary | ICD-10-CM | POA: Insufficient documentation

## 2012-04-26 DIAGNOSIS — Z96659 Presence of unspecified artificial knee joint: Secondary | ICD-10-CM | POA: Insufficient documentation

## 2012-04-28 ENCOUNTER — Ambulatory Visit: Payer: 59 | Admitting: Physical Therapy

## 2012-04-30 ENCOUNTER — Ambulatory Visit: Payer: 59 | Admitting: Physical Therapy

## 2012-05-03 ENCOUNTER — Ambulatory Visit: Payer: 59 | Admitting: Physical Therapy

## 2012-05-05 ENCOUNTER — Other Ambulatory Visit (HOSPITAL_COMMUNITY): Payer: 59

## 2012-05-05 ENCOUNTER — Ambulatory Visit: Payer: 59 | Admitting: Physical Therapy

## 2012-05-10 ENCOUNTER — Ambulatory Visit: Payer: 59 | Admitting: Physical Therapy

## 2012-05-12 ENCOUNTER — Ambulatory Visit: Payer: 59 | Admitting: Physical Therapy

## 2012-05-17 ENCOUNTER — Ambulatory Visit: Payer: 59 | Admitting: Physical Therapy

## 2012-05-20 ENCOUNTER — Ambulatory Visit: Payer: 59 | Admitting: Physical Therapy

## 2012-05-24 ENCOUNTER — Ambulatory Visit: Payer: 59 | Attending: Neurology | Admitting: Physical Therapy

## 2012-05-24 DIAGNOSIS — M4120 Other idiopathic scoliosis, site unspecified: Secondary | ICD-10-CM | POA: Insufficient documentation

## 2012-05-24 DIAGNOSIS — R269 Unspecified abnormalities of gait and mobility: Secondary | ICD-10-CM | POA: Insufficient documentation

## 2012-05-24 DIAGNOSIS — IMO0001 Reserved for inherently not codable concepts without codable children: Secondary | ICD-10-CM | POA: Insufficient documentation

## 2012-05-24 DIAGNOSIS — Z96659 Presence of unspecified artificial knee joint: Secondary | ICD-10-CM | POA: Insufficient documentation

## 2012-05-24 DIAGNOSIS — Z96649 Presence of unspecified artificial hip joint: Secondary | ICD-10-CM | POA: Insufficient documentation

## 2012-05-24 DIAGNOSIS — M6281 Muscle weakness (generalized): Secondary | ICD-10-CM | POA: Insufficient documentation

## 2012-05-27 ENCOUNTER — Ambulatory Visit: Payer: 59 | Admitting: Physical Therapy

## 2012-05-28 ENCOUNTER — Ambulatory Visit: Payer: 59 | Admitting: Physical Therapy

## 2013-01-11 ENCOUNTER — Other Ambulatory Visit: Payer: Self-pay

## 2013-01-11 DIAGNOSIS — Z1231 Encounter for screening mammogram for malignant neoplasm of breast: Secondary | ICD-10-CM

## 2013-01-28 ENCOUNTER — Telehealth: Payer: Self-pay | Admitting: Neurology

## 2013-01-28 NOTE — Telephone Encounter (Signed)
reschedule appt per yan, pt will be recoverving from surgery, will call back and reschedule

## 2013-02-03 ENCOUNTER — Ambulatory Visit
Admission: RE | Admit: 2013-02-03 | Discharge: 2013-02-03 | Disposition: A | Discharge status: Home / Self Care | Payer: BC Managed Care – PPO | Source: Ambulatory Visit

## 2013-02-03 DIAGNOSIS — Z1231 Encounter for screening mammogram for malignant neoplasm of breast: Secondary | ICD-10-CM

## 2013-02-08 ENCOUNTER — Other Ambulatory Visit: Payer: Self-pay | Admitting: Family Medicine

## 2013-02-08 DIAGNOSIS — R928 Other abnormal and inconclusive findings on diagnostic imaging of breast: Secondary | ICD-10-CM

## 2013-02-22 ENCOUNTER — Encounter (HOSPITAL_COMMUNITY): Payer: Self-pay

## 2013-02-22 ENCOUNTER — Encounter (HOSPITAL_COMMUNITY): Payer: Self-pay | Admitting: Pharmacy Technician

## 2013-02-22 ENCOUNTER — Encounter (HOSPITAL_COMMUNITY)
Admission: RE | Admit: 2013-02-22 | Discharge: 2013-02-22 | Disposition: A | Discharge status: Home / Self Care | Payer: BC Managed Care – PPO | Source: Ambulatory Visit | Attending: Orthopedic Surgery | Admitting: Orthopedic Surgery

## 2013-02-22 DIAGNOSIS — Z01818 Encounter for other preprocedural examination: Secondary | ICD-10-CM | POA: Insufficient documentation

## 2013-02-22 DIAGNOSIS — Z01812 Encounter for preprocedural laboratory examination: Secondary | ICD-10-CM | POA: Insufficient documentation

## 2013-02-22 HISTORY — DX: Depression, unspecified: F32.A

## 2013-02-22 HISTORY — DX: Other complications of anesthesia, initial encounter: T88.59XA

## 2013-02-22 HISTORY — DX: Anxiety disorder, unspecified: F41.9

## 2013-02-22 HISTORY — DX: Major depressive disorder, single episode, unspecified: F32.9

## 2013-02-22 HISTORY — DX: Adverse effect of unspecified anesthetic, initial encounter: T41.45XA

## 2013-02-22 LAB — CBC
MCH: 28.3 pg (ref 26.0–34.0)
MCHC: 32.1 g/dL (ref 30.0–36.0)
MCV: 88.2 fL (ref 78.0–100.0)
Platelets: 261 10*3/uL (ref 150–400)
RBC: 4.56 MIL/uL (ref 3.87–5.11)
RDW: 13.1 % (ref 11.5–15.5)

## 2013-02-22 LAB — BASIC METABOLIC PANEL
CO2: 33 mEq/L — ABNORMAL HIGH (ref 19–32)
Calcium: 9.3 mg/dL (ref 8.4–10.5)
Creatinine, Ser: 0.65 mg/dL (ref 0.50–1.10)
GFR calc Af Amer: >90 mL/min (ref >90)
GFR calc non Af Amer: >90 mL/min (ref >90)
Glucose, Bld: 86 mg/dL (ref 70–99)
Sodium: 141 mEq/L (ref 135–145)

## 2013-02-22 NOTE — Pre-Procedure Instructions (Signed)
Carmen Strickland  02/22/2013   Your procedure is scheduled on:  Thursday, March 03, 2013  Report to [facility] Short Stay (use Main Entrance "A'') at  8:00 AM.  Call this number if you have problems the morning of surgery: [phone]   Remember:   Do not eat food or drink liquids after midnight.   Take these medicines the morning of surgery with A SIP OF WATER:    Do not wear jewelry, make-up or nail polish.  Do not wear lotions, powders, or perfumes. You may NOT wear deodorant.  Do not shave 48 hours prior to surgery.  Do not bring valuables to the hospital.  [facility] is not responsible for any belongings or valuables.               Contacts, dentures or bridgework may not be worn into surgery.  Leave suitcase in the car. After surgery it may be brought to your room.  For patients admitted to the hospital, discharge time is determined by your treatment team.               Patients discharged the day of surgery will not be allowed to drive home.  Name and phone number of your driver:   Special Instructions: Shower using CHG 2 nights before surgery and the night before surgery.  If you shower the day of surgery use CHG.  Use special wash - you have one bottle of CHG for all showers.  You should use approximately 1/3 of the bottle for each shower.   Please read over the following fact sheets that you were given: Pain Booklet, Coughing and Deep Breathing and Surgical Site Infection Prevention

## 2013-02-22 NOTE — Pre-Procedure Instructions (Signed)
Carmen Strickland  02/22/2013   Your procedure is scheduled on:  03/03/13  Report to [facility] Short Stay [facility]  2 * 3 at 8 AM.  Call this number if you have problems the morning of surgery: [phone]   Remember:   Do not eat food or drink liquids after midnight.   Take these medicines the morning of surgery with A SIP OF WATER: oxycodine,gabapentin,prozac,zanaflex   Do not wear jewelry, make-up or nail polish.  Do not wear lotions, powders, or perfumes. You may wear deodorant.  Do not shave 48 hours prior to surgery. Men may shave face and neck.  Do not bring valuables to the hospital.  [facility] is not responsible                  for any belongings or valuables.               Contacts, dentures or bridgework may not be worn into surgery.  Leave suitcase in the car. After surgery it may be brought to your room.  For patients admitted to the hospital, discharge time is determined by your                treatment team.               Patients discharged the day of surgery will not be allowed to drive  home.  Name and phone number of your driver: family  Special Instructions: Incentive Spirometry - Practice and bring it with you on the day of surgery.   Please read over the following fact sheets that you were given: Pain Booklet, Coughing and Deep Breathing, Blood Transfusion Information and Surgical Site Infection Prevention

## 2013-02-25 ENCOUNTER — Encounter: Payer: Self-pay | Admitting: Nurse Practitioner

## 2013-02-28 ENCOUNTER — Ambulatory Visit
Admission: RE | Admit: 2013-02-28 | Discharge: 2013-02-28 | Disposition: A | Discharge status: Home / Self Care | Payer: BC Managed Care – PPO | Source: Ambulatory Visit | Attending: Family Medicine | Admitting: Family Medicine

## 2013-02-28 DIAGNOSIS — R928 Other abnormal and inconclusive findings on diagnostic imaging of breast: Secondary | ICD-10-CM

## 2013-03-02 MED ORDER — CHLORHEXIDINE GLUCONATE 4 % EX LIQD: 60.0000 mL | Freq: Once | CUTANEOUS | Status: DC

## 2013-03-02 MED ORDER — DEXTROSE 5 % IV SOLN
3.0000 g | INTRAVENOUS | Status: AC
  Administered 2013-03-03: 3 g via INTRAVENOUS
  Filled 2013-03-02: qty 3000

## 2013-03-03 ENCOUNTER — Ambulatory Visit (HOSPITAL_COMMUNITY): Payer: BC Managed Care – PPO | Admitting: Critical Care Medicine

## 2013-03-03 ENCOUNTER — Encounter (HOSPITAL_COMMUNITY): Payer: BC Managed Care – PPO | Admitting: Critical Care Medicine

## 2013-03-03 ENCOUNTER — Ambulatory Visit (HOSPITAL_COMMUNITY)
Admission: RE | Admit: 2013-03-03 | Discharge: 2013-03-04 | DRG: 483 | Disposition: A | Discharge status: Home / Self Care | Payer: BC Managed Care – PPO | Source: Ambulatory Visit | Attending: Orthopedic Surgery | Admitting: Orthopedic Surgery

## 2013-03-03 ENCOUNTER — Encounter (HOSPITAL_COMMUNITY)
Admission: RE | Disposition: A | Discharge status: Home / Self Care | Payer: Self-pay | Source: Ambulatory Visit | Attending: Orthopedic Surgery

## 2013-03-03 ENCOUNTER — Encounter (HOSPITAL_COMMUNITY): Payer: Self-pay | Admitting: Critical Care Medicine

## 2013-03-03 DIAGNOSIS — Z96619 Presence of unspecified artificial shoulder joint: Secondary | ICD-10-CM

## 2013-03-03 DIAGNOSIS — M4150 Other secondary scoliosis, site unspecified: Secondary | ICD-10-CM | POA: Insufficient documentation

## 2013-03-03 DIAGNOSIS — Z96649 Presence of unspecified artificial hip joint: Secondary | ICD-10-CM | POA: Insufficient documentation

## 2013-03-03 DIAGNOSIS — M810 Age-related osteoporosis without current pathological fracture: Secondary | ICD-10-CM | POA: Insufficient documentation

## 2013-03-03 DIAGNOSIS — F341 Dysthymic disorder: Secondary | ICD-10-CM | POA: Insufficient documentation

## 2013-03-03 DIAGNOSIS — M19019 Primary osteoarthritis, unspecified shoulder: Secondary | ICD-10-CM | POA: Insufficient documentation

## 2013-03-03 DIAGNOSIS — Z96659 Presence of unspecified artificial knee joint: Secondary | ICD-10-CM | POA: Insufficient documentation

## 2013-03-03 HISTORY — PX: TOTAL SHOULDER ARTHROPLASTY: SHX126

## 2013-03-03 HISTORY — PX: TOTAL SHOULDER REPLACEMENT: SUR1217

## 2013-03-03 HISTORY — DX: Congenital dislocation of hip, unspecified: Q65.2

## 2013-03-03 LAB — TYPE AND SCREEN: ABO/RH(D): O POS

## 2013-03-03 LAB — ABO/RH: ABO/RH(D): O POS

## 2013-03-03 SURGERY — ARTHROPLASTY, SHOULDER, TOTAL
Anesthesia: Regional | Site: Shoulder | Laterality: Right

## 2013-03-03 MED ORDER — ALUM & MAG HYDROXIDE-SIMETH 200-200-20 MG/5ML PO SUSP: 30.0000 mL | ORAL | Status: DC | PRN

## 2013-03-03 MED ORDER — NEOSTIGMINE METHYLSULFATE 1 MG/ML IJ SOLN
INTRAMUSCULAR | Status: DC | PRN
  Administered 2013-03-03: 3 mg via INTRAVENOUS

## 2013-03-03 MED ORDER — PROPOFOL 10 MG/ML IV BOLUS
INTRAVENOUS | Status: DC | PRN
  Administered 2013-03-03: 10 mg via INTRAVENOUS
  Administered 2013-03-03: 250 mg via INTRAVENOUS

## 2013-03-03 MED ORDER — ACETAMINOPHEN 325 MG PO TABS: 650.0000 mg | ORAL_TABLET | Freq: Four times a day (QID) | ORAL | Status: DC | PRN

## 2013-03-03 MED ORDER — HYDROMORPHONE HCL PF 1 MG/ML IJ SOLN
INTRAMUSCULAR | Status: AC
  Filled 2013-03-03: qty 1

## 2013-03-03 MED ORDER — MENTHOL 3 MG MT LOZG: 1.0000 | LOZENGE | OROMUCOSAL | Status: DC | PRN

## 2013-03-03 MED ORDER — DOCUSATE SODIUM 100 MG PO CAPS
100.0000 mg | ORAL_CAPSULE | Freq: Two times a day (BID) | ORAL | Status: DC
  Administered 2013-03-03 – 2013-03-04 (×2): 100 mg via ORAL
  Filled 2013-03-03 (×2): qty 1

## 2013-03-03 MED ORDER — FENTANYL CITRATE 0.05 MG/ML IJ SOLN
INTRAMUSCULAR | Status: DC | PRN
  Administered 2013-03-03: 50 ug via INTRAVENOUS
  Administered 2013-03-03 (×2): 100 ug via INTRAVENOUS

## 2013-03-03 MED ORDER — POLYETHYLENE GLYCOL 3350 17 G PO PACK: 17.0000 g | PACK | Freq: Every day | ORAL | Status: DC | PRN

## 2013-03-03 MED ORDER — METOCLOPRAMIDE HCL 10 MG PO TABS: 5.0000 mg | ORAL_TABLET | Freq: Three times a day (TID) | ORAL | Status: DC | PRN

## 2013-03-03 MED ORDER — FENTANYL 25 MCG/HR TD PT72
25.0000 ug | MEDICATED_PATCH | TRANSDERMAL | Status: DC
  Filled 2013-03-03: qty 1

## 2013-03-03 MED ORDER — ONDANSETRON HCL 4 MG PO TABS: 4.0000 mg | ORAL_TABLET | Freq: Four times a day (QID) | ORAL | Status: DC | PRN

## 2013-03-03 MED ORDER — CEFAZOLIN SODIUM 1-5 GM-% IV SOLN
1.0000 g | Freq: Four times a day (QID) | INTRAVENOUS | Status: AC
  Administered 2013-03-03 – 2013-03-04 (×3): 1 g via INTRAVENOUS
  Filled 2013-03-03 (×3): qty 50

## 2013-03-03 MED ORDER — LACTATED RINGERS IV SOLN: INTRAVENOUS | Status: DC

## 2013-03-03 MED ORDER — HYDROMORPHONE HCL 2 MG PO TABS
2.0000 mg | ORAL_TABLET | ORAL | Status: DC | PRN
  Administered 2013-03-04: 2 mg via ORAL
  Filled 2013-03-03: qty 1

## 2013-03-03 MED ORDER — ROCURONIUM BROMIDE 100 MG/10ML IV SOLN
INTRAVENOUS | Status: DC | PRN
  Administered 2013-03-03: 25 mg via INTRAVENOUS

## 2013-03-03 MED ORDER — TEMAZEPAM 15 MG PO CAPS: 15.0000 mg | ORAL_CAPSULE | Freq: Every evening | ORAL | Status: DC | PRN

## 2013-03-03 MED ORDER — OXYCODONE-ACETAMINOPHEN 5-325 MG PO TABS
1.0000 | ORAL_TABLET | ORAL | Status: DC | PRN
  Administered 2013-03-03: 2 via ORAL
  Administered 2013-03-03: 1 via ORAL
  Administered 2013-03-04 (×2): 2 via ORAL
  Filled 2013-03-03: qty 1
  Filled 2013-03-03 (×3): qty 2

## 2013-03-03 MED ORDER — EPHEDRINE SULFATE 50 MG/ML IJ SOLN
INTRAMUSCULAR | Status: DC | PRN
  Administered 2013-03-03: 5 mg via INTRAVENOUS

## 2013-03-03 MED ORDER — METHOCARBAMOL 500 MG PO TABS
500.0000 mg | ORAL_TABLET | Freq: Four times a day (QID) | ORAL | Status: DC | PRN
  Administered 2013-03-04 (×2): 500 mg via ORAL
  Filled 2013-03-03 (×3): qty 1

## 2013-03-03 MED ORDER — METOCLOPRAMIDE HCL 5 MG/ML IJ SOLN: 5.0000 mg | Freq: Three times a day (TID) | INTRAMUSCULAR | Status: DC | PRN

## 2013-03-03 MED ORDER — ACETAMINOPHEN 650 MG RE SUPP: 650.0000 mg | Freq: Four times a day (QID) | RECTAL | Status: DC | PRN

## 2013-03-03 MED ORDER — KETOROLAC TROMETHAMINE 15 MG/ML IJ SOLN
15.0000 mg | Freq: Four times a day (QID) | INTRAMUSCULAR | Status: DC
  Administered 2013-03-03 – 2013-03-04 (×3): 15 mg via INTRAVENOUS
  Filled 2013-03-03 (×10): qty 1

## 2013-03-03 MED ORDER — LACTATED RINGERS IV SOLN
INTRAVENOUS | Status: DC
  Administered 2013-03-03: 10 mL/h via INTRAVENOUS
  Administered 2013-03-03: 11:00:00 via INTRAVENOUS

## 2013-03-03 MED ORDER — PHENOL 1.4 % MT LIQD: 1.0000 | OROMUCOSAL | Status: DC | PRN

## 2013-03-03 MED ORDER — HYDROMORPHONE HCL PF 1 MG/ML IJ SOLN
INTRAMUSCULAR | Status: AC
  Administered 2013-03-03: 1 mg
  Filled 2013-03-03: qty 1

## 2013-03-03 MED ORDER — SODIUM CHLORIDE 0.9 % IR SOLN
Status: DC | PRN
  Administered 2013-03-03: 1000 mL

## 2013-03-03 MED ORDER — DIPHENHYDRAMINE HCL 12.5 MG/5ML PO ELIX: 12.5000 mg | ORAL_SOLUTION | ORAL | Status: DC | PRN

## 2013-03-03 MED ORDER — HYDROMORPHONE HCL PF 1 MG/ML IJ SOLN
0.2500 mg | INTRAMUSCULAR | Status: AC | PRN
  Administered 2013-03-03 (×8): 0.5 mg via INTRAVENOUS

## 2013-03-03 MED ORDER — BISACODYL 5 MG PO TBEC: 5.0000 mg | DELAYED_RELEASE_TABLET | Freq: Every day | ORAL | Status: DC | PRN

## 2013-03-03 MED ORDER — ONDANSETRON HCL 4 MG/2ML IJ SOLN
INTRAMUSCULAR | Status: DC | PRN
  Administered 2013-03-03: 4 mg via INTRAVENOUS

## 2013-03-03 MED ORDER — SUCCINYLCHOLINE CHLORIDE 20 MG/ML IJ SOLN
INTRAMUSCULAR | Status: DC | PRN
  Administered 2013-03-03: 100 mg via INTRAVENOUS

## 2013-03-03 MED ORDER — LIDOCAINE HCL (CARDIAC) 20 MG/ML IV SOLN
INTRAVENOUS | Status: DC | PRN
  Administered 2013-03-03: 80 mg via INTRAVENOUS

## 2013-03-03 MED ORDER — GABAPENTIN 300 MG PO CAPS
300.0000 mg | ORAL_CAPSULE | Freq: Three times a day (TID) | ORAL | Status: DC
  Administered 2013-03-03 – 2013-03-04 (×2): 300 mg via ORAL
  Filled 2013-03-03 (×4): qty 1

## 2013-03-03 MED ORDER — HYDROMORPHONE HCL PF 1 MG/ML IJ SOLN
INTRAMUSCULAR | Status: AC
  Filled 2013-03-03: qty 2

## 2013-03-03 MED ORDER — FLUOXETINE HCL 20 MG PO CAPS
60.0000 mg | ORAL_CAPSULE | Freq: Every day | ORAL | Status: DC
  Administered 2013-03-04: 60 mg via ORAL
  Filled 2013-03-03: qty 3

## 2013-03-03 MED ORDER — METHOCARBAMOL 100 MG/ML IJ SOLN
500.0000 mg | Freq: Four times a day (QID) | INTRAVENOUS | Status: DC | PRN
  Filled 2013-03-03: qty 5

## 2013-03-03 MED ORDER — FLEET ENEMA 7-19 GM/118ML RE ENEM: 1.0000 | ENEMA | Freq: Once | RECTAL | Status: AC | PRN

## 2013-03-03 MED ORDER — ONDANSETRON HCL 4 MG/2ML IJ SOLN: 4.0000 mg | Freq: Once | INTRAMUSCULAR | Status: DC | PRN

## 2013-03-03 MED ORDER — ONDANSETRON HCL 4 MG/2ML IJ SOLN: 4.0000 mg | Freq: Four times a day (QID) | INTRAMUSCULAR | Status: DC | PRN

## 2013-03-03 MED ORDER — FENTANYL CITRATE 0.05 MG/ML IJ SOLN
INTRAMUSCULAR | Status: AC
  Administered 2013-03-03: 100 ug via INTRAVENOUS
  Filled 2013-03-03: qty 2

## 2013-03-03 MED ORDER — GLYCOPYRROLATE 0.2 MG/ML IJ SOLN
INTRAMUSCULAR | Status: DC | PRN
  Administered 2013-03-03: 0.4 mg via INTRAVENOUS

## 2013-03-03 MED ORDER — HYDROMORPHONE HCL PF 1 MG/ML IJ SOLN
0.2500 mg | INTRAMUSCULAR | Status: DC | PRN
  Administered 2013-03-03 – 2013-03-04 (×2): 1 mg via INTRAVENOUS
  Filled 2013-03-03 (×2): qty 1

## 2013-03-03 MED ORDER — DEXAMETHASONE SODIUM PHOSPHATE 4 MG/ML IJ SOLN
INTRAMUSCULAR | Status: DC | PRN
  Administered 2013-03-03: 4 mg via INTRAVENOUS

## 2013-03-03 SURGICAL SUPPLY — 62 items
BLADE SAW SGTL 83.5X18.5 (BLADE) ×2 IMPLANT
CEMENT BONE DEPUY (Cement) ×2 IMPLANT
CLOTH BEACON ORANGE TIMEOUT ST (SAFETY) ×2 IMPLANT
CLSR STERI-STRIP ANTIMIC 1/2X4 (GAUZE/BANDAGES/DRESSINGS) ×2 IMPLANT
COVER SURGICAL LIGHT HANDLE (MISCELLANEOUS) ×2 IMPLANT
DRAPE INCISE IOBAN 66X45 STRL (DRAPES) ×2 IMPLANT
DRAPE SURG 17X11 SM STRL (DRAPES) ×2 IMPLANT
DRAPE SURG 17X23 STRL (DRAPES) ×2 IMPLANT
DRAPE U-SHAPE 47X51 STRL (DRAPES) ×2 IMPLANT
DRILL BIT 7/64X5 (BIT) IMPLANT
DRSG AQUACEL AG ADV 3.5X10 (GAUZE/BANDAGES/DRESSINGS) ×2 IMPLANT
DRSG MEPILEX BORDER 4X8 (GAUZE/BANDAGES/DRESSINGS) ×2 IMPLANT
DURAPREP 26ML APPLICATOR (WOUND CARE) ×4 IMPLANT
ELECT BLADE 4.0 EZ CLEAN MEGAD (MISCELLANEOUS) ×2
ELECT CAUTERY BLADE 6.4 (BLADE) ×2 IMPLANT
ELECT REM PT RETURN 9FT ADLT (ELECTROSURGICAL) ×2
ELECTRODE BLDE 4.0 EZ CLN MEGD (MISCELLANEOUS) ×1 IMPLANT
ELECTRODE REM PT RTRN 9FT ADLT (ELECTROSURGICAL) ×1 IMPLANT
FACESHIELD LNG OPTICON STERILE (SAFETY) ×6 IMPLANT
GLENOID ANCHOR PEG CROSSLK 44 (Orthopedic Implant) ×2 IMPLANT
GLOVE BIO SURGEON STRL SZ7.5 (GLOVE) ×2 IMPLANT
GLOVE BIO SURGEON STRL SZ8 (GLOVE) ×2 IMPLANT
GLOVE EUDERMIC 7 POWDERFREE (GLOVE) ×2 IMPLANT
GLOVE SS BIOGEL STRL SZ 7.5 (GLOVE) ×1 IMPLANT
GLOVE SUPERSENSE BIOGEL SZ 7.5 (GLOVE) ×1
GOWN STRL NON-REIN LRG LVL3 (GOWN DISPOSABLE) ×2 IMPLANT
GOWN STRL REIN XL XLG (GOWN DISPOSABLE) ×4 IMPLANT
HEAD HUM ECCENTRIC 44X18 STRL (Trauma) ×2 IMPLANT
HUMERAL STEM 10MM (Trauma) ×2 IMPLANT
KIT BASIN OR (CUSTOM PROCEDURE TRAY) ×2 IMPLANT
KIT ROOM TURNOVER OR (KITS) ×2 IMPLANT
MANIFOLD NEPTUNE II (INSTRUMENTS) ×2 IMPLANT
NDL SUT 6 .5 CRC .975X.05 MAYO (NEEDLE) ×1 IMPLANT
NEEDLE HYPO 25GX1X1/2 BEV (NEEDLE) IMPLANT
NEEDLE MAYO TAPER (NEEDLE) ×1
NS IRRIG 1000ML POUR BTL (IV SOLUTION) ×2 IMPLANT
PACK SHOULDER (CUSTOM PROCEDURE TRAY) ×2 IMPLANT
PAD ARMBOARD 7.5X6 YLW CONV (MISCELLANEOUS) ×4 IMPLANT
PASSER SUT SWANSON 36MM LOOP (INSTRUMENTS) IMPLANT
PIN METAGLENE 2.5 (PIN) ×4 IMPLANT
SLING ARM FOAM STRAP LRG (SOFTGOODS) ×2 IMPLANT
SLING ARM FOAM STRAP XLG (SOFTGOODS) ×2 IMPLANT
SLING SWATHE LARGE (SOFTGOODS) ×2 IMPLANT
SMARTMIX MINI TOWER (MISCELLANEOUS) ×2
SPONGE LAP 18X18 X RAY DECT (DISPOSABLE) ×2 IMPLANT
SPONGE LAP 4X18 X RAY DECT (DISPOSABLE) ×2 IMPLANT
STEM HUMERAL 10MM (Trauma) ×1 IMPLANT
STRIP CLOSURE SKIN 1/2X4 (GAUZE/BANDAGES/DRESSINGS) ×2 IMPLANT
SUCTION FRAZIER TIP 10 FR DISP (SUCTIONS) ×4 IMPLANT
SUT BONE WAX W31G (SUTURE) IMPLANT
SUT FIBERWIRE #2 38 T-5 BLUE (SUTURE) ×4
SUT MNCRL AB 3-0 PS2 18 (SUTURE) ×2 IMPLANT
SUT VIC AB 1 CT1 27 (SUTURE) ×3
SUT VIC AB 1 CT1 27XBRD ANBCTR (SUTURE) ×3 IMPLANT
SUT VIC AB 2-0 CT1 27 (SUTURE) ×2
SUT VIC AB 2-0 CT1 TAPERPNT 27 (SUTURE) ×2 IMPLANT
SUTURE FIBERWR #2 38 T-5 BLUE (SUTURE) ×2 IMPLANT
SYR CONTROL 10ML LL (SYRINGE) IMPLANT
TOWEL OR 17X24 6PK STRL BLUE (TOWEL DISPOSABLE) ×2 IMPLANT
TOWEL OR 17X26 10 PK STRL BLUE (TOWEL DISPOSABLE) ×2 IMPLANT
TOWER SMARTMIX MINI (MISCELLANEOUS) ×1 IMPLANT
WATER STERILE IRR 1000ML POUR (IV SOLUTION) ×2 IMPLANT

## 2013-03-03 NOTE — H&P (Signed)
Carmen Strickland    Chief Complaint: OA OF RIGHT SHOULDER HPI: The patient is a 47 y.o. female with end stage right shoulder OA  Past Medical History  Diagnosis Date  . Arthritis   . Osteoporosis   . Complication of anesthesia     pt states easily sedated  . Anxiety   . Depression   . Scoliosis     Past Surgical History  Procedure Laterality Date  . Joint replacement      hips/knees    x4    Family History  Problem Relation Age of Onset  . Multiple sclerosis Mother     Social History:  reports that she has never smoked. She does not have any smokeless tobacco history on file. She reports that she drinks alcohol. She reports that she does not use illicit drugs.  Allergies: No Known Allergies  Medications Prior to Admission  Medication Sig Dispense Refill  . fentaNYL (DURAGESIC - DOSED MCG/HR) 25 MCG/HR patch Place 25 mcg onto the skin every 3 (three) days.      Marland Kitchen FLUoxetine (PROZAC) 20 MG capsule Take 60 mg by mouth daily.      Marland Kitchen gabapentin (NEURONTIN) 300 MG capsule Take 300 mg by mouth 3 (three) times daily.      Marland Kitchen oxyCODONE-acetaminophen (PERCOCET) 10-325 MG per tablet Take 1 tablet by mouth every 4 (four) hours as needed for pain.      Marland Kitchen tiZANidine (ZANAFLEX) 4 MG tablet Take 4 mg by mouth daily as needed for muscle spasms.          Physical Exam: right shoulder with painful and restricted motion as oted at recent office visits  Vitals  Temp:  [98.4 F (36.9 C)] 98.4 F (36.9 C) (12/11 0902) Pulse Rate:  [64-80] 73 (12/11 0938) Resp:  [8-23] 13 (12/11 0938) BP: (174-201)/(87-113) 174/87 mmHg (12/11 0938) SpO2:  [98 %-100 %] 100 % (12/11 0938)  Assessment/Plan  Impression: OA OF RIGHT SHOULDER  Plan of Action: Procedure(s): RIGHT TOTAL SHOULDER ARTHROPLASTY  Carmen Strickland M 03/03/2013, 9:40 AM

## 2013-03-03 NOTE — Progress Notes (Signed)
Time out was performed per protocol with Dr. Katrinka Blazing prior to block.

## 2013-03-03 NOTE — Op Note (Addendum)
03/03/2013  12:05 PM  PATIENT:   Danice Goltz  47 y.o. female  PRE-OPERATIVE DIAGNOSIS:  OA OF RIGHT SHOULDER  POST-OPERATIVE DIAGNOSIS:  same  PROCEDURE:  R TSA #8stem, 44X18 eccentric head, 44 glenoid  SURGEON:  Yunus Stoklosa, Vania Rea M.D.  ASSISTANTS: Shuford pac   ANESTHESIA:   GET + ISB  EBL: 200  SPECIMEN:  none  Drains: none   PATIENT DISPOSITION:  PACU - hemodynamically stable.    PLAN OF CARE: Admit to inpatient   Dictation# [id number]  #10 stem was utilized

## 2013-03-03 NOTE — Anesthesia Postprocedure Evaluation (Signed)
  Anesthesia Post-op Note  Patient: Carmen Strickland  Procedure(s) Performed: Procedure(s): RIGHT TOTAL SHOULDER ARTHROPLASTY (Right)  Patient Location: PACU  Anesthesia Type:GA combined with regional for post-op pain  Level of Consciousness: awake, alert , oriented and patient cooperative  Airway and Oxygen Therapy: Patient Spontanous Breathing  Post-op Pain: mild  Post-op Assessment: Post-op Vital signs reviewed, Patient's Cardiovascular Status Stable, Respiratory Function Stable, Patent Airway, No signs of Nausea or vomiting and Pain level controlled  Post-op Vital Signs: stable  Complications: No apparent anesthesia complications

## 2013-03-03 NOTE — Preoperative (Signed)
Beta Blockers   Reason not to administer Beta Blockers:Not Applicable 

## 2013-03-03 NOTE — Anesthesia Preprocedure Evaluation (Addendum)
Anesthesia Evaluation  Patient identified by MRN, date of birth, ID band Patient awake    Airway Mallampati: II TM Distance: >3 FB Neck ROM: Full    Dental  (+) Dental Advisory Given and Teeth Intact   Pulmonary          Cardiovascular     Neuro/Psych PSYCHIATRIC DISORDERS Anxiety Depression    GI/Hepatic   Endo/Other  Morbid obesity  Renal/GU      Musculoskeletal  (+) Arthritis -, Osteoarthritis,    Abdominal   Peds  Hematology   Anesthesia Other Findings   Reproductive/Obstetrics                         Anesthesia Physical Anesthesia Plan  ASA: II  Anesthesia Plan: General and Regional   Post-op Pain Management:    Induction: Intravenous  Airway Management Planned: Oral ETT  Additional Equipment:   Intra-op Plan:   Post-operative Plan: Extubation in OR  Informed Consent: I have reviewed the patients History and Physical, chart, labs and discussed the procedure including the risks, benefits and alternatives for the proposed anesthesia with the patient or authorized representative who has indicated his/her understanding and acceptance.   Dental advisory given  Plan Discussed with: Anesthesiologist and Surgeon  Anesthesia Plan Comments:         Anesthesia Quick Evaluation

## 2013-03-03 NOTE — Anesthesia Procedure Notes (Addendum)
Anesthesia Regional Block:  Interscalene brachial plexus block  Pre-Anesthetic Checklist: ,, timeout performed, Correct Patient, Correct Site, Correct Laterality, Correct Procedure, Correct Position, site marked, Risks and benefits discussed,  Surgical consent,  Pre-op evaluation,  At surgeon's request and post-op pain management  Laterality: Right  Prep: chloraprep and alcohol swabs       Needles:  Injection technique: Single-shot  Needle Type: Stimulator Needle - 40          Additional Needles:  Procedures: nerve stimulator Interscalene brachial plexus block  Nerve Stimulator or Paresthesia:  Response: 0.5 mA, 0.1 ms, 3 cm  Additional Responses:   Narrative:  Start time: 03/03/2013 9:20 AM End time: 03/03/2013 9:25 AM Injection made incrementally with aspirations every 5 mL.  Performed by: Personally  Anesthesiologist: Maren Beach MD  Additional Notes: Pt accepts procedure w/ risks. 16cc 0.5% Marcaine w/ epi w/o difficulty w/ mim discomfort. GES   Procedure Name: Intubation Date/Time: 03/03/2013 12:12 PM Performed by: Elon Alas Pre-anesthesia Checklist: Patient identified, Timeout performed, Emergency Drugs available, Suction available and Patient being monitored Patient Re-evaluated:Patient Re-evaluated prior to inductionOxygen Delivery Method: Circle system utilized Preoxygenation: Pre-oxygenation with 100% oxygen Intubation Type: IV induction Ventilation: Mask ventilation without difficulty Laryngoscope Size: Miller and 2 Grade View: Grade I Tube type: Oral Tube size: 7.0 mm Number of attempts: 1 Airway Equipment and Method: Stylet Placement Confirmation: positive ETCO2,  ETT inserted through vocal cords under direct vision and breath sounds checked- equal and bilateral Secured at: 21 cm Tube secured with: Tape Dental Injury: Teeth and Oropharynx as per pre-operative assessment

## 2013-03-03 NOTE — Progress Notes (Signed)
Utilization review completed.  

## 2013-03-03 NOTE — Transfer of Care (Signed)
Immediate Anesthesia Transfer of Care Note  Patient: Carmen Strickland  Procedure(s) Performed: Procedure(s): RIGHT TOTAL SHOULDER ARTHROPLASTY (Right)  Patient Location: PACU  Anesthesia Type:GA combined with regional for post-op pain  Level of Consciousness: awake, alert  and oriented  Airway & Oxygen Therapy: Patient Spontanous Breathing and Patient connected to nasal cannula oxygen  Post-op Assessment: Report given to PACU RN and Post -op Vital signs reviewed and stable  Post vital signs: Reviewed and stable  Complications: No apparent anesthesia complications

## 2013-03-04 ENCOUNTER — Encounter (HOSPITAL_COMMUNITY): Payer: Self-pay | Admitting: Orthopedic Surgery

## 2013-03-04 MED ORDER — DIAZEPAM 5 MG PO TABS: 5.0000 mg | ORAL_TABLET | Freq: Four times a day (QID) | ORAL | Status: DC | PRN

## 2013-03-04 MED ORDER — HYDROMORPHONE HCL 2 MG PO TABS: 2.0000 mg | ORAL_TABLET | ORAL | Status: DC | PRN

## 2013-03-04 NOTE — Op Note (Signed)
Carmen Strickland, Carmen Strickland            ACCOUNT NO.:  [id number]  MEDICAL RECORD NO.:  [id number]  LOCATION:  [id number]                        FACILITY:  [facility]  PHYSICIAN:  Vania Rea. Kristyne Woodring, M.D.  DATE OF BIRTH:  November 15, 1965  DATE OF PROCEDURE:  03/03/2013 DATE OF DISCHARGE:                              OPERATIVE REPORT   PREOPERATIVE DIAGNOSIS:  End-stage right shoulder osteoarthritis.  POSTOPERATIVE DIAGNOSIS:  End-stage right shoulder osteoarthritis.  PROCEDURE:  Right total shoulder arthroplasty utilizing a press-fit size 10 DePuy global stem with a 44 x 18 eccentric head and a cemented pegged 44 glenoid.  SURGEON:  Vania Rea. Richey Doolittle, M.D.  Threasa HeadsFrench Ana A. Shuford, PA-C  ANESTHESIA:  General endotracheal as well as an interscalene block.  ESTIMATED BLOOD LOSS:  Minimal.  DRAINS:  None.  HISTORY:  Ms. Blecha is a 47 year old female who has had chronic and progressive increasing right shoulder pain secondary to end-stage osteoarthritis.  Due to her increasing pain and functional limitations, she is brought to the operating room at this time for planned right total shoulder arthroplasty.  Preoperatively, I counseled Ms. Mccaffrey regarding treatment options and risks versus benefits thereof.  Possible surgical complications were reviewed including the potential for bleeding, infection, neurovascular injury, persistent pain, loss of motion, anesthetic complication, and possible need for additional surgery.  She understands and accepts and agrees to planned procedure.  Preoperatively, her x-rays did show severe bony deformity about the shoulder joint with subchondral sclerosis, peripheral osteophyte formation, loss of joint space, and end-stage arthrosis.  PROCEDURE IN DETAIL:  After undergoing routine preop evaluation, the patient received prophylactic antibiotics.  An interscalene block was established in the holding area by the Anesthesia Department.  Placed supine on the  operating table, underwent smooth induction of a general endotracheal anesthesia.  Placed in the beach-chair position and appropriately padded and protected.  The right shoulder girdle region was sterilely prepped and draped in standard fashion.  Time-out was called.  An anterior approach to the right shoulder was made through a 12-mm deltopectoral incision.  Skin flaps were elevated.  Electrocautery were used for hemostasis.  The deltopectoral interval was identified and developed bluntly with the cephalic vein retracted laterally.  The deltoid in the upper cm and half of the pectoralis major tendon was divided to improve visualization.  Adhesions in the subacromial/subdeltoid bursal region were divided and the conjoined tendon was identified, mobilized and retracted medially.  The bicipital groove was then unroofed.  Biceps tendon tenotomized for later tenodesis.  We then split the rotator cuff along the rotator interval from a bicipital groove to the base of the coracoid and the subscapularis was divided from the lesser tuberosity leaving a 1 cm cuff of tissue for later repair.  The free margin of the subscapularis was tagged with #2 FiberWire sutures.  We then divided the capsular tissues anteriorly, inferiorly and posteroinferiorly allowing delivery of the humeral head through the wound.  The extramedullary guide was then used to outline our proposed humeral head resection was performed with an oscillating saw.  Care taken to protect the rotator cuff superiorly and posteriorly.  We then used a hand reaming to gain access to the humeral medullary canal, then  reamed up to size appropriate for a 10.  We then used the appropriate broach and broached up to size 10 and then we used a rongeur to remove the osteophytes from the margin of the metaphyseal region of the proximal humerus.  At this point, a metal cap was then placed over the cut surface of the proximal humerus and then we  exposed the glenoid using a combination of pitchfork, snake tongue and Fukuda retractors.  I performed a circumferential labral resection removing the proximal residual proximal stump of the biceps and gaining circumferential exposure of the glenoid and also divided the capsule since the subscapularis could be completely mobilized.  At this point, a guide pin was then directed into the center of the glenoid and this was reamed with a 44 mm reamer which showed the best fit.  The central drill hole was then placed; followed by the peripheral peg holes and then trial glenoid showed good fit.  We then removed all residual, soft tissue and bony debris.  The final head was meticulously cleaned and dried.  Cement was mixed and introduced into the peripheral peg holes using the cement pressurizer and the final peg glenoid was impacted into position, and the cement was allowed to harden with excellent fit and fixation.  We then returned our attention to the proximal humerus where the size 10 stem was then directed into the femoral shaft.  We used abundant bone graft harvested from the reamings and from the humeral head to bone graft the size 10 implant and it was introduced maintaining approximately 30 degrees retroversion by making the native retroversion and excellent fit and fixation was achieved.  We then performed a series of trial reductions and ultimately the 44 x 18 eccentric head showed the best soft tissue coverage.  The final 44 x 18 mm head was then impacted into position after the [facility] taper was carefully cleaned and dried. Final reduction was performed showing excellent coverage, fit and fixation, soft tissue balance and appropriate humeral head translation. At this point, the irrigation was then completed.  The subscapularis was repaired back to the lesser tuberosity through the soft tissue using the #2 Fiber wires and then also repaired the rotator interval laterally with pair of  figure-of-eight #2 FiberWire sutures.  The biceps tendon was then tenodesed at the level of the upper pectoralis major with the #2 FiberWires.  All sutures were appropriately trimmed.  Wounds were irrigated.  Hemostasis was obtained.  The deltopectoral interval was then reapproximated with a series of #1 Vicryl sutures, figure-of-eight. A 2-0 Vicryl were used for the subcu layer and intracuticular 3-0 Monocryl for the skin, followed by Steri-Strips.  Dry dressing was then applied over the right shoulder.  Right arm was placed in a sling.  The patient was awakened, extubated, and taken to recovery room in stable condition.     Vania Rea. Carla Rashad, M.D.     KMS/MEDQ  D:  03/03/2013  T:  03/04/2013  Job:  [id number]

## 2013-03-04 NOTE — Evaluation (Signed)
Occupational Therapy Evaluation Patient Details Name: Carmen Strickland MRN: [id number] DOB: [birth year] Today's Date: 03/04/2013 Time: 4098-1191 OT Time Calculation (min): 67 min  OT Assessment / Plan / Recommendation History of present illness R TSA on 03/03/13   Clinical Impression   Pt is a 47y/o female s/p R TSA. She was educated in ADL's, sling use and HEP/shoulder protocol as approved by Dr. Rennis Chris. Pt plans to d/c home w/ PRN family assist later today. She should f/u w/ MD for progression of HEP, no further OT needs at this time.    OT Assessment  Progress rehab of shoulder as ordered by MD at follow-up appointment    Follow Up Recommendations  No OT follow up    Barriers to Discharge      Equipment Recommendations  None recommended by OT (Pt reports that she has DME, may benefit from [facility] reacher)    Recommendations for Other Services    Frequency       Precautions / Restrictions Precautions Precautions: Shoulder;Fall Shoulder Interventions: Shoulder sling/immobilizer Restrictions Weight Bearing Restrictions: Yes RUE Weight Bearing: Non weight bearing   Pertinent Vitals/Pain 4/10, R shoulder. RN made aware & gave medication. Pt repositioned, elevated, rest, ice.    ADL  Eating/Feeding: Performed;Set up Where Assessed - Eating/Feeding: Edge of bed;Chair Grooming: Performed;Wash/dry hands;Wash/dry face;Teeth care;Brushing hair;Modified independent Where Assessed - Grooming: Unsupported standing Upper Body Bathing: Simulated;Min guard Where Assessed - Upper Body Bathing: Supported sitting Lower Body Bathing: Simulated;Min guard Where Assessed - Lower Body Bathing: Supported sit to stand Upper Body Dressing: Performed;Supervision/safety;Min guard Where Assessed - Upper Body Dressing: Unsupported sit to stand Lower Body Dressing: Performed;Supervision/safety;Min guard Where Assessed - Lower Body Dressing: Supported sit to Pharmacist, hospital:  Research scientist (life sciences) Method: Sit to Barista: Comfort height toilet Toileting - Architect and Hygiene: Performed;Supervision/safety Where Assessed - Engineer, mining and Hygiene: Sit on 3-in-1 or toilet Tub/Shower Transfer Method: Not assessed Equipment Used: Cane;Other (comment) (R UE in sling) Transfers/Ambulation Related to ADLs: Pt overall supervision level transfers at this time. Pt reports near/at baseline using personal cane. ADL Comments: Pt was educated in shoulder protocol as approved by Dr. Rennis Chris. Pt was educated in sling use, care & precautions, HEP and ADL's. She plans to d/c home later today w/ PRN husband & family assist intermittently.    OT Diagnosis:    OT Problem List:   OT Treatment Interventions:     OT Goals(Current goals can be found in the care plan section) Acute Rehab OT Goals Patient Stated Goal: Go home later today   Visit Information  Last OT Received On: 03/04/13 History of Present Illness: R TSA on 03/03/13       Prior Functioning     Home Living Family/patient expects to be discharged to:: Private residence Living Arrangements: Spouse/significant other Available Help at Discharge: Available PRN/intermittently;Family Type of Home: House Home Access: Stairs to enter [employer] of Steps: 1 STE Home Layout: One level Home Equipment: Walker - 2 wheels;Cane - single point;Wheelchair - Careers adviser (comment) (Lift chair) Prior Function Level of Independence: Independent Communication Communication: No difficulties Dominant Hand: Left    Vision/Perception Vision - History Baseline Vision: Wears glasses all the time Patient Visual Report: No change from baseline   Cognition  Cognition Arousal/Alertness: Awake/alert Behavior During Therapy: WFL for tasks assessed/performed Overall Cognitive Status: Within Functional Limits for tasks assessed     Extremity/Trunk Assessment Upper Extremity Assessment Upper Extremity Assessment: RUE deficits/detail RUE Deficits / Details:  S/P Right TSA, in sling RUE: Unable to fully assess due to immobilization    Mobility Bed Mobility Bed Mobility: Supine to Sit;Sitting - Scoot to Edge of Bed Supine to Sit: 6: Modified independent (Device/Increase time);HOB flat Sitting - Scoot to Edge of Bed: 6: Modified independent (Device/Increase time) Transfers Transfers: Sit to Stand;Stand to Sit Sit to Stand: 5: Supervision;From bed;From chair/3-in-1;From toilet;With armrests Stand to Sit: 5: Supervision;With armrests;To chair/3-in-1;To toilet Details for Transfer Assistance: Pt demonstrates good safe technique w/ functional mobility and transfers.    Exercise Shoulder Exercises Pendulum Exercise: PROM;Right;10 reps;Standing Shoulder Flexion: PROM;Right;10 reps;Supine Shoulder ABduction: PROM;Right;10 reps Elbow Flexion: AROM;Right;10 reps;Seated Elbow Extension: AROM;Right;10 reps;Seated Wrist Flexion: AROM;Right;10 reps Wrist Extension: AROM;Right;10 reps Digit Composite Flexion: AROM;Right;10 reps Composite Extension: AROM;Right;10 reps Neck Lateral Flexion - Right: AROM;5 reps Neck Lateral Flexion - Left: AROM;5 reps Donning/doffing shirt without moving shoulder: Set-up Method for sponge bathing under operated UE: Set-up;Min-guard Donning/doffing sling/immobilizer: Set-up;Min-guard Correct positioning of sling/immobilizer: Modified independent Pendulum exercises (written home exercise program): Supervision/safety ROM for elbow, wrist and digits of operated UE: Modified independent Sling wearing schedule (on at all times/off for ADL's): Modified independent Positioning of UE while sleeping: Modified independent   Balance Balance Balance Assessed: Yes Static Sitting Balance Static Sitting - Balance Support: No upper extremity supported;Feet supported Static Standing Balance Static Standing -  Balance Support: No upper extremity supported   End of Session OT - End of Session Equipment Utilized During Treatment: Other (comment) (Cane & sling R UE) Activity Tolerance: Patient tolerated treatment well Patient left: in chair;with call bell/phone within reach Nurse Communication: Mobility status  GO     Roselie Awkward Dixon 03/04/2013, 9:34 AM

## 2013-03-04 NOTE — Progress Notes (Signed)
Carmen Strickland to be D/C'd Home per MD order. Discussed with the patient and all questions fully answered.    Medication List    STOP taking these medications       oxyCODONE-acetaminophen 10-325 MG per tablet  Commonly known as:  PERCOCET     tiZANidine 4 MG tablet  Commonly known as:  ZANAFLEX      TAKE these medications       diazepam 5 MG tablet  Commonly known as:  VALIUM  Take 1 tablet (5 mg total) by mouth every 6 (six) hours as needed for anxiety or muscle spasms.     fentaNYL 25 MCG/HR patch  Commonly known as:  DURAGESIC - dosed mcg/hr  Place 25 mcg onto the skin every 3 (three) days.     FLUoxetine 20 MG capsule  Commonly known as:  PROZAC  Take 60 mg by mouth daily.     gabapentin 300 MG capsule  Commonly known as:  NEURONTIN  Take 300 mg by mouth 3 (three) times daily.     HYDROmorphone 2 MG tablet  Commonly known as:  DILAUDID  Take 1-3 tablets (2-6 mg total) by mouth every 4 (four) hours as needed.        VVS, Skin clean, dry and intact without evidence of skin break down, no evidence of skin tears noted.  IV catheter discontinued intact. Site without signs and symptoms of complications. Dressing and pressure applied.  An After Visit Summary was printed and given to the patient.  Patient escorted via WC, and D/C home via private auto.  Kai Levins  03/04/2013 11:49 AM

## 2013-03-04 NOTE — Discharge Summary (Signed)
PATIENT ID:      Carmen Strickland  MRN:     [id number] DOB/AGE:    49-Aug-1967 / 49 y.o.     DISCHARGE SUMMARY  ADMISSION DATE:    03/03/2013 DISCHARGE DATE:    ADMISSION DIAGNOSIS: OA OF RIGHT SHOULDER Past Medical History  Diagnosis Date  . Arthritis   . Osteoporosis   . Complication of anesthesia     pt states easily sedated  . Anxiety   . Depression   . Scoliosis   . Developmental displacement hip     DISCHARGE DIAGNOSIS:   Active Problems:   S/P shoulder replacement   PROCEDURE: Procedure(s): RIGHT TOTAL SHOULDER ARTHROPLASTY on 03/03/2013  CONSULTS:   none  HISTORY:  See H&P in chart.  HOSPITAL COURSE:  Carmen Strickland is a 47 y.o. admitted on 03/03/2013 with a chief complaint of right shoulder pain and dysfunction, and found to have a diagnosis of OA OF RIGHT SHOULDER.  They were brought to the operating room on 03/03/2013 and underwent Procedure(s): RIGHT TOTAL SHOULDER ARTHROPLASTY.    They were given perioperative antibiotics: Anti-infectives   Start     Dose/Rate Route Frequency Ordered Stop   03/03/13 1600  ceFAZolin (ANCEF) IVPB 1 g/50 mL premix     1 g 100 mL/hr over 30 Minutes Intravenous Every 6 hours 03/03/13 1453 03/04/13 0435   03/03/13 0600  ceFAZolin (ANCEF) 3 g in dextrose 5 % 50 mL IVPB     3 g 160 mL/hr over 30 Minutes Intravenous On call to O.R. 03/02/13 1421 03/03/13 1013    .  Patient underwent the above named procedure and tolerated it well. The following day they were hemodynamically stable and pain was controlled on oral analgesics. They were neurovascularly intact to the operative extremity. OT was ordered and worked with patient per protocol. They were medically and orthopaedically stable for discharge on Day 1     DIAGNOSTIC STUDIES:  RECENT RADIOGRAPHIC STUDIES :  Mm Digital Diag [facility] L  03/03/2013   CLINICAL DATA:  The patient returns for further evaluation of a questionable mass in the left breast seen on prior film screen  mammogram performed on 02/03/2013  EXAM: DIGITAL DIAGNOSTIC  left MAMMOGRAM  COMPARISON:  Film screen mammographic evaluation from [facility] in [place] dated 02/24/2005  ACR Breast Density Category b: There are scattered areas of fibroglandular density.  FINDINGS: Spot compression views confirm a nodular density in the mid lower portion of the left breast which appears mildly lobulated and circumscribed on the craniocaudad view and is partially obscured on the medial lateral oblique view. This is isodense with the remaining breast tissue and measures approximately 4 mm. Comparing spot compression views with prior film screen exams, this nodular density is felt to be present previously but more conspicuous due to technical differences. Lack of change since 2006 would suggest that this is related to a benign finding. No associated calcifications or distortion are seen.  IMPRESSION: Nodular density in the inner lower portion of the left breast is felt to be stable in comparison to prior exams from 2006 but more conspicuous due to technical differences. Lack of change since 2006 would correspond with this representing a benign process.  RECOMMENDATION: Six-month followup followup of the left breast mammographically is recommended to confirm short-term stability of this suspected benign nodule with digital imaging as it is slightly better seen than on prior film screen views.  I have discussed the findings and recommendations with the  patient. Results were also provided in writing at the conclusion of the visit. If applicable, a reminder letter will be sent to the patient regarding the next appointment.  BI-RADS CATEGORY  3: Probably benign finding(s) - short interval follow-up suggested.   Electronically Signed   By: Leda Gauze M.D.   On: 03/03/2013 06:42   Mm Digital Screening  02/07/2013   CLINICAL DATA:  Screening.  EXAM: DIGITAL SCREENING BILATERAL MAMMOGRAM WITH CAD   COMPARISON:  Previous exam(s)  ACR Breast Density Category c: The breasts are heterogeneously dense, which may obscure small masses.  FINDINGS: In the left breast, a possible mass warrants further evaluation with spot compression views and possibly ultrasound. In the right breast, no masses or malignant type calcifications are identified. Images were processed with CAD.  IMPRESSION: Further evaluation is suggested for possible mass in the left breast.  RECOMMENDATION: Diagnostic mammogram and possibly ultrasound of the left breast. (Code:FI-L-34M)  The patient will be contacted regarding the findings, and additional imaging will be scheduled.  BI-RADS CATEGORY  0: Incomplete. Need additional imaging evaluation and/or prior mammograms for comparison.   Electronically Signed   By: Rosalie Gums M.D.   On: 02/07/2013 12:43    RECENT VITAL SIGNS:  Patient Vitals for the past 24 hrs:  BP Temp Temp src Pulse Resp SpO2 Height Weight  03/04/13 0532 152/85 mmHg 98.9 F (37.2 C) Oral 77 16 96 % - -  03/04/13 0006 125/64 mmHg 99 F (37.2 C) Oral 80 14 94 % - -  03/03/13 2213 129/79 mmHg 98.3 F (36.8 C) Oral 68 14 95 % - -  03/03/13 1425 147/76 mmHg 98 F (36.7 C) Oral 72 - 97 % 5\' 3"  (1.6 m) 88.451 kg (195 lb)  03/03/13 1400 - 97.8 F (36.6 C) - 69 8 98 % - -  03/03/13 1356 154/93 mmHg - - 68 8 97 % - -  03/03/13 1350 - - - 67 9 97 % - -  03/03/13 1345 - - - 67 8 97 % - -  03/03/13 1341 164/98 mmHg - - 65 9 97 % - -  03/03/13 1330 - - - 65 9 97 % - -  03/03/13 1326 164/97 mmHg - - 65 9 97 % - -  03/03/13 1315 - - - 64 10 97 % - -  03/03/13 1311 162/93 mmHg - - 64 9 96 % - -  03/03/13 1307 - - - 68 12 97 % - -  03/03/13 1302 - - - 63 12 96 % - -  03/03/13 1300 - - - 64 12 96 % - -  03/03/13 1256 163/92 mmHg - - 62 14 93 % - -  03/03/13 1250 - - - 61 13 94 % - -  03/03/13 1245 - - - - - 95 % - -  03/03/13 1243 172/93 mmHg - - 65 9 91 % - -  03/03/13 1240 - - - - 13 - - -  03/03/13 1228 153/84 mmHg -  - 102 16 94 % - -  03/03/13 1225 - 98.2 F (36.8 C) - 106 15 93 % - -  03/03/13 0945 - - - 81 14 100 % - -  03/03/13 0943 157/78 mmHg - - 73 13 99 % - -  03/03/13 0940 - - - 73 17 100 % - -  03/03/13 0938 174/87 mmHg - - 73 13 100 % - -  03/03/13 0937 - - - 75 16  100 % - -  03/03/13 0936 - - - 79 18 100 % - -  03/03/13 0935 - - - 76 19 99 % - -  03/03/13 0934 - - - 77 23 100 % - -  03/03/13 0933 174/88 mmHg - - 71 10 100 % - -  03/03/13 0932 - - - 80 14 100 % - -  03/03/13 0931 - - - 73 12 100 % - -  03/03/13 0930 - - - 75 18 100 % - -  03/03/13 0929 174/88 mmHg - - 74 15 100 % - -  03/03/13 0928 - - - 68 17 100 % - -  03/03/13 0927 - - - 66 19 100 % - -  03/03/13 0926 - - - 65 15 100 % - -  03/03/13 0925 - - - 64 13 100 % - -  03/03/13 0924 - - - 65 12 100 % - -  03/03/13 0923 - - - 65 15 100 % - -  03/03/13 0922 - - - 65 15 100 % - -  03/03/13 0921 - - - 64 13 100 % - -  03/03/13 0920 - - - 68 8 100 % - -  03/03/13 0915 201/113 mmHg - - - - 100 % - -  03/03/13 0902 - 98.4 F (36.9 C) Oral 70 20 98 % - -  .  RECENT EKG RESULTS:   No orders found for this or any previous visit.  DISCHARGE INSTRUCTIONS:    DISCHARGE MEDICATIONS:     Medication List    STOP taking these medications       oxyCODONE-acetaminophen 10-325 MG per tablet  Commonly known as:  PERCOCET     tiZANidine 4 MG tablet  Commonly known as:  ZANAFLEX      TAKE these medications       diazepam 5 MG tablet  Commonly known as:  VALIUM  Take 1 tablet (5 mg total) by mouth every 6 (six) hours as needed for anxiety or muscle spasms.     fentaNYL 25 MCG/HR patch  Commonly known as:  DURAGESIC - dosed mcg/hr  Place 25 mcg onto the skin every 3 (three) days.     FLUoxetine 20 MG capsule  Commonly known as:  PROZAC  Take 60 mg by mouth daily.     gabapentin 300 MG capsule  Commonly known as:  NEURONTIN  Take 300 mg by mouth 3 (three) times daily.     HYDROmorphone 2 MG tablet  Commonly known  as:  DILAUDID  Take 1-3 tablets (2-6 mg total) by mouth every 4 (four) hours as needed.        FOLLOW UP VISIT:       Follow-up Information   Follow up with Senaida Lange, MD. (call to be seen in 10-14 days)    Specialty:  Orthopedic Surgery   Contact information:   [address] [place] [address] [phone]       DISCHARGE TO: Home  DISPOSITION: Good  DISCHARGE CONDITION:  Rodolph Bong for Dr. Francena Hanly 03/04/2013, 7:57 AM

## 2013-03-21 ENCOUNTER — Ambulatory Visit: Payer: 59 | Admitting: Nurse Practitioner

## 2013-05-28 ENCOUNTER — Encounter (HOSPITAL_COMMUNITY): Payer: Self-pay | Admitting: Emergency Medicine

## 2013-05-28 ENCOUNTER — Emergency Department (HOSPITAL_COMMUNITY): Payer: Medicaid Other

## 2013-05-28 ENCOUNTER — Emergency Department (HOSPITAL_COMMUNITY)
Admission: EM | Admit: 2013-05-28 | Discharge: 2013-05-28 | Disposition: A | Discharge status: Home / Self Care | Payer: Medicaid Other | Attending: Emergency Medicine | Admitting: Emergency Medicine

## 2013-05-28 DIAGNOSIS — M129 Arthropathy, unspecified: Secondary | ICD-10-CM | POA: Insufficient documentation

## 2013-05-28 DIAGNOSIS — W19XXXA Unspecified fall, initial encounter: Secondary | ICD-10-CM

## 2013-05-28 DIAGNOSIS — Z79899 Other long term (current) drug therapy: Secondary | ICD-10-CM | POA: Insufficient documentation

## 2013-05-28 DIAGNOSIS — S025XXA Fracture of tooth (traumatic), initial encounter for closed fracture: Secondary | ICD-10-CM

## 2013-05-28 DIAGNOSIS — F329 Major depressive disorder, single episode, unspecified: Secondary | ICD-10-CM | POA: Insufficient documentation

## 2013-05-28 DIAGNOSIS — S01501A Unspecified open wound of lip, initial encounter: Secondary | ICD-10-CM | POA: Insufficient documentation

## 2013-05-28 DIAGNOSIS — F3289 Other specified depressive episodes: Secondary | ICD-10-CM | POA: Insufficient documentation

## 2013-05-28 DIAGNOSIS — S01511A Laceration without foreign body of lip, initial encounter: Secondary | ICD-10-CM

## 2013-05-28 DIAGNOSIS — Y929 Unspecified place or not applicable: Secondary | ICD-10-CM | POA: Insufficient documentation

## 2013-05-28 DIAGNOSIS — Y939 Activity, unspecified: Secondary | ICD-10-CM | POA: Insufficient documentation

## 2013-05-28 DIAGNOSIS — F411 Generalized anxiety disorder: Secondary | ICD-10-CM | POA: Insufficient documentation

## 2013-05-28 DIAGNOSIS — W010XXA Fall on same level from slipping, tripping and stumbling without subsequent striking against object, initial encounter: Secondary | ICD-10-CM | POA: Insufficient documentation

## 2013-05-28 NOTE — Discharge Instructions (Signed)
Be sure to apply ice to your lip on and off throughout the day for about 10-15 minutes. Follow up with your primary care doctor and the dentist. Facial Laceration  A facial laceration is a cut on the face. These injuries can be painful and cause bleeding. Lacerations usually heal quickly, but they need special care to reduce scarring. DIAGNOSIS  Your health care provider will take a medical history, ask for details about how the injury occurred, and examine the wound to determine how deep the cut is. TREATMENT  Some facial lacerations may not require closure. Others may not be able to be closed because of an increased risk of infection. The risk of infection and the chance for successful closure will depend on various factors, including the amount of time since the injury occurred. The wound may be cleaned to help prevent infection. If closure is appropriate, pain medicines may be given if needed. Your health care provider will use stitches (sutures), wound glue (adhesive), or skin adhesive strips to repair the laceration. These tools bring the skin edges together to allow for faster healing and a better cosmetic outcome. If needed, you may also be given a tetanus shot. HOME CARE INSTRUCTIONS  Only take over-the-counter or prescription medicines as directed by your health care provider.  Follow your health care provider's instructions for wound care. These instructions will vary depending on the technique used for closing the wound. For Sutures:  Keep the wound clean and dry.   If you were given a bandage (dressing), you should change it at least once a day. Also change the dressing if it becomes wet or dirty, or as directed by your health care provider.   Wash the wound with soap and water 2 times a day. Rinse the wound off with water to remove all soap. Pat the wound dry with a clean towel.   After cleaning, apply a thin layer of the antibiotic ointment recommended by your health care  provider. This will help prevent infection and keep the dressing from sticking.   You may shower as usual after the first 24 hours. Do not soak the wound in water until the sutures are removed.   Get your sutures removed as directed by your health care provider. With facial lacerations, sutures should usually be taken out after 4 5 days to avoid stitch marks.   Wait a few days after your sutures are removed before applying any makeup. For Skin Adhesive Strips:  Keep the wound clean and dry.   Do not get the skin adhesive strips wet. You may bathe carefully, using caution to keep the wound dry.   If the wound gets wet, pat it dry with a clean towel.   Skin adhesive strips will fall off on their own. You may trim the strips as the wound heals. Do not remove skin adhesive strips that are still stuck to the wound. They will fall off in time.  For Wound Adhesive:  You may briefly wet your wound in the shower or bath. Do not soak or scrub the wound. Do not swim. Avoid periods of heavy sweating until the skin adhesive has fallen off on its own. After showering or bathing, gently pat the wound dry with a clean towel.   Do not apply liquid medicine, cream medicine, ointment medicine, or makeup to your wound while the skin adhesive is in place. This may loosen the film before your wound is healed.   If a dressing is placed over the  wound, be careful not to apply tape directly over the skin adhesive. This may cause the adhesive to be pulled off before the wound is healed.   Avoid prolonged exposure to sunlight or tanning lamps while the skin adhesive is in place.  The skin adhesive will usually remain in place for 5 10 days, then naturally fall off the skin. Do not pick at the adhesive film.  After Healing: Once the wound has healed, cover the wound with sunscreen during the day for 1 full year. This can help minimize scarring. Exposure to ultraviolet light in the first year will darken  the scar. It can take 1 2 years for the scar to lose its redness and to heal completely.  SEEK IMMEDIATE MEDICAL CARE IF:  You have redness, pain, or swelling around the wound.   You see ayellowish-white fluid (pus) coming from the wound.   You have chills or a fever.  MAKE SURE YOU:  Understand these instructions.  Will watch your condition.  Will get help right away if you are not doing well or get worse. Document Released: 04/17/2004 Document Revised: 12/29/2012 Document Reviewed: 10/21/2012 [facility] Patient Information 2014 [place].  Laceration Care, Adult A laceration is a cut or lesion that goes through all layers of the skin and into the tissue just beneath the skin. TREATMENT  Some lacerations may not require closure. Some lacerations may not be able to be closed due to an increased risk of infection. It is important to see your caregiver as soon as possible after an injury to minimize the risk of infection and maximize the opportunity for successful closure. If closure is appropriate, pain medicines may be given, if needed. The wound will be cleaned to help prevent infection. Your caregiver will use stitches (sutures), staples, wound glue (adhesive), or skin adhesive strips to repair the laceration. These tools bring the skin edges together to allow for faster healing and a better cosmetic outcome. However, all wounds will heal with a scar. Once the wound has healed, scarring can be minimized by covering the wound with sunscreen during the day for 1 full year. HOME CARE INSTRUCTIONS  For sutures or staples:  Keep the wound clean and dry.  If you were given a bandage (dressing), you should change it at least once a day. Also, change the dressing if it becomes wet or dirty, or as directed by your caregiver.  Wash the wound with soap and water 2 times a day. Rinse the wound off with water to remove all soap. Pat the wound dry with a clean towel.  After cleaning,  apply a thin layer of the antibiotic ointment as recommended by your caregiver. This will help prevent infection and keep the dressing from sticking.  You may shower as usual after the first 24 hours. Do not soak the wound in water until the sutures are removed.  Only take over-the-counter or prescription medicines for pain, discomfort, or fever as directed by your caregiver.  Get your sutures or staples removed as directed by your caregiver. For skin adhesive strips:  Keep the wound clean and dry.  Do not get the skin adhesive strips wet. You may bathe carefully, using caution to keep the wound dry.  If the wound gets wet, pat it dry with a clean towel.  Skin adhesive strips will fall off on their own. You may trim the strips as the wound heals. Do not remove skin adhesive strips that are still stuck to the wound. They  will fall off in time. For wound adhesive:  You may briefly wet your wound in the shower or bath. Do not soak or scrub the wound. Do not swim. Avoid periods of heavy perspiration until the skin adhesive has fallen off on its own. After showering or bathing, gently pat the wound dry with a clean towel.  Do not apply liquid medicine, cream medicine, or ointment medicine to your wound while the skin adhesive is in place. This may loosen the film before your wound is healed.  If a dressing is placed over the wound, be careful not to apply tape directly over the skin adhesive. This may cause the adhesive to be pulled off before the wound is healed.  Avoid prolonged exposure to sunlight or tanning lamps while the skin adhesive is in place. Exposure to ultraviolet light in the first year will darken the scar.  The skin adhesive will usually remain in place for 5 to 10 days, then naturally fall off the skin. Do not pick at the adhesive film. You may need a tetanus shot if:  You cannot remember when you had your last tetanus shot.  You have never had a tetanus shot. If you get  a tetanus shot, your arm may swell, get red, and feel warm to the touch. This is common and not a problem. If you need a tetanus shot and you choose not to have one, there is a rare chance of getting tetanus. Sickness from tetanus can be serious. SEEK MEDICAL CARE IF:   You have redness, swelling, or increasing pain in the wound.  You see a red line that goes away from the wound.  You have yellowish-white fluid (pus) coming from the wound.  You have a fever.  You notice a bad smell coming from the wound or dressing.  Your wound breaks open before or after sutures have been removed.  You notice something coming out of the wound such as wood or glass.  Your wound is on your hand or foot and you cannot move a finger or toe. SEEK IMMEDIATE MEDICAL CARE IF:   Your pain is not controlled with prescribed medicine.  You have severe swelling around the wound causing pain and numbness or a change in color in your arm, hand, leg, or foot.  Your wound splits open and starts bleeding.  You have worsening numbness, weakness, or loss of function of any joint around or beyond the wound.  You develop painful lumps near the wound or on the skin anywhere on your body. MAKE SURE YOU:   Understand these instructions.  Will watch your condition.  Will get help right away if you are not doing well or get worse. Document Released: 03/10/2005 Document Revised: 06/02/2011 Document Reviewed: 09/03/2010 [facility] Patient Information 2014 [place], [place].  Mouth Laceration A mouth laceration is a cut inside the mouth. TREATMENT  Because of all the bacteria in the mouth, lacerations are usually not stitched (sutured) unless the wound is gaping open. Sometimes, a couple sutures may be placed just to hold the edges of the wound together and to speed healing. Over the next 1 to 2 days, you will see that the wound edges appear gray in color. The edges may appear ragged and slightly spread apart. Because of  all the normal bacteria in the mouth, these wounds are contaminated, but this is not an infection that needs antibiotics. Most wounds heal with no problems despite their appearance. HOME CARE INSTRUCTIONS   Rinse your mouth  with a warm, saltwater wash 4 to 6 times per day, or as your caregiver instructs.  Continue oral hygiene and gentle tooth brushing as normal, if possible.  Do not eat or drink hot food or beverages while your mouth is still numb.  Eat a bland diet to avoid irritation from acidic foods.  Only take over-the-counter or prescription medicines for pain, discomfort, or fever as directed by your caregiver.  Follow up with your caregiver as instructed. You may need to see your caregiver for a wound check in 48 to 72 hours to make sure your wound is healing.  If your laceration was sutured, do not play with the sutures or knots with your tongue. If you do this, they will gradually loosen and may become untied. You may need a tetanus shot if:  You cannot remember when you had your last tetanus shot.  You have never had a tetanus shot. If you get a tetanus shot, your arm may swell, get red, and feel warm to the touch. This is common and not a problem. If you need a tetanus shot and you choose not to have one, there is a rare chance of getting tetanus. Sickness from tetanus can be serious. SEEK MEDICAL CARE IF:   You develop swelling or increasing pain in the wound or in other parts of your face.  You have a fever.  You develop swollen, tender glands in the throat.  You notice the wound edges do not stay together after your sutures have been removed.  You see pus coming from the wound. Some drainage in the mouth is normal. MAKE SURE YOU:   Understand these instructions.  Will watch your condition.  Will get help right away if you are not doing well or get worse. Document Released: 03/10/2005 Document Revised: 06/02/2011 Document Reviewed: 09/12/2010 [facility] Patient  Information 2014 [place], [place].

## 2013-05-28 NOTE — ED Notes (Addendum)
Pt tripped over a step and landed on her face. She has a laceration in her lower lip, with minimal bleeding now, she has been applying ice. She chipped some of her teeth but does not think they are loose. She denies loc. She is A&Ox4. She is wearing a fentanyl patch for chronic back pain

## 2013-05-28 NOTE — ED Provider Notes (Signed)
Medical screening examination/treatment/procedure(s) were conducted as a shared visit with non-physician practitioner(s) or resident and myself. I personally evaluated the patient during the encounter and agree with the findings and plan unless otherwise indicated.  I have personally reviewed any xrays and/ or EKG's with the provider and I agree with interpretation.  Mechanical fall, recalls all details, pt unable to get hands infront in time, she missed the last step. Chin and lip abrasions, laceration 1.5 cm left lower lip, upper incisor tooth fx, bleeding controlled of lip lac, no vermilion border involvement. No midline neck pain, full rom. Right anterior shoulder pain, pt had recent surgery, decr rom due to pain. Lac repaired by PA. CNs intact. No vomiting, no blood thinners. No indication for CT head at this time.  Head injury, lip laceration, right shoulder pain   Mariea Clonts, MD 05/28/13 [phone]

## 2013-05-28 NOTE — ED Notes (Signed)
Ice pack given to pt for lip

## 2013-05-28 NOTE — ED Notes (Signed)
PA at bedside with suture cart.

## 2013-05-28 NOTE — ED Provider Notes (Signed)
CSN: [id number]     Arrival date & time 05/28/13  1311 History   First MD Initiated Contact with Patient 05/28/13 1316     Chief Complaint  Patient presents with  . Fall     (Consider location/radiation/quality/duration/timing/severity/associated sxs/prior Treatment) HPI Comments: Patient is a 48 year old female who presents to the emergency department with a laceration to her lower lip after tripping over a step and landing directly onto her face on cement. No loss of consciousness. States she chipped one of her upper front teeth but does not feel as if her teeth are loose. Pain currently 4/10. Also states she is having right shoulder pain described as a soreness, worse with movement. She believes she broke her fall with her right hand. Had a shoulder replacement in December 2014. Currently she is wearing a fentanyl patch for her chronic back pain.  Patient is a 48 y.o. female presenting with fall. The history is provided by the patient.  Fall    Past Medical History  Diagnosis Date  . Arthritis   . Osteoporosis   . Complication of anesthesia     pt states easily sedated  . Anxiety   . Depression   . Scoliosis   . Developmental displacement hip    Past Surgical History  Procedure Laterality Date  . Joint replacement      hips/knees    x4  . 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 Total shoulder arthroplasty Right 03/03/2013    Procedure: RIGHT TOTAL SHOULDER ARTHROPLASTY;  Surgeon: Marin Shutter, MD;  Location: [place];  Service: Orthopedics;  Laterality: Right;   Family History  Problem Relation Age of Onset  . Multiple sclerosis Mother    History  Substance Use Topics  . Smoking status: Never Smoker   . Smokeless tobacco: Never Used  . Alcohol Use: Yes     Comment: daily   OB History   Grav Para Term Preterm Abortions TAB SAB Ect Mult Living                 Review of Systems  HENT: Positive for dental problem.   Musculoskeletal:   Positive for right shoulder pain.  Skin: Positive for wound.  All other systems reviewed and are negative.      Allergies  Review of patient's allergies indicates no known allergies.  Home Medications   Current Outpatient Rx  Name  Route  Sig  Dispense  Refill  . fentaNYL (DURAGESIC - DOSED MCG/HR) 25 MCG/HR patch   Transdermal   Place 25 mcg onto the skin every 3 (three) days.         Marland Kitchen FLUoxetine (PROZAC) 20 MG capsule   Oral   Take 60 mg by mouth daily.         Marland Kitchen gabapentin (NEURONTIN) 300 MG capsule   Oral   Take 300 mg by mouth 3 (three) times daily.         Marland Kitchen oxyCODONE-acetaminophen (PERCOCET) 10-325 MG per tablet   Oral   Take 1 tablet by mouth every 4 (four) hours as needed for pain.          BP 140/84  Pulse 63  Temp(Src) 98.7 F (37.1 C) (Oral)  Resp 18  SpO2 100% Physical Exam  Nursing note and vitals reviewed. Constitutional: She is oriented to person, place, and time. She appears well-developed and well-nourished. No distress.  HENT:  Head: Normocephalic and atraumatic.  Mouth/Throat: Uvula is midline  and oropharynx is clear and moist.    No loose dentition.  Eyes: Conjunctivae and EOM are normal. Pupils are equal, round, and reactive to light.  Neck: Normal range of motion. Neck supple.  Cardiovascular: Normal rate, regular rhythm, normal heart sounds and intact distal pulses.   Pulmonary/Chest: Effort normal and breath sounds normal.  Musculoskeletal: She exhibits no edema.  R shoulder non-tender, no deformity. ROM with flexion limited to 100 degrees due to pain.  Neurological: She is alert and oriented to person, place, and time.  Skin: Skin is warm and dry. She is not diaphoretic.  Psychiatric: She has a normal mood and affect. Her behavior is normal.    ED Course  Dental Date/Time: 05/28/2013 2:06 PM Performed by: Illene Labrador Authorized by: Illene Labrador Consent: Verbal consent obtained. Risks and benefits: risks, benefits  and alternatives were discussed Consent given by: patient Patient understanding: patient states understanding of the procedure being performed Patient identity confirmed: verbally with patient and arm band Local anesthesia used: yes Anesthesia: nerve block Local anesthetic: bupivacaine 0.5% without epinephrine Anesthetic total: 1 ml Patient sedated: no Comments: Dental block.   (including critical care time) LACERATION REPAIR Performed by: Michele Mcalpine Authorized by: Michele Mcalpine Consent: Verbal consent obtained. Risks and benefits: risks, benefits and alternatives were discussed Consent given by: patient Patient identity confirmed: provided demographic data Prepped and Draped in normal sterile fashion Wound explored  Laceration Location: lower lip  Laceration Length: 1.5 cm  No Foreign Bodies seen or palpated  Anesthesia: dental block as mentioned above  Anesthetic total: 1 ml  Irrigation method: syringe Amount of cleaning: standard  Skin closure: 6-0 chromic gut  Number of sutures: 5  Technique: simple interrupted  Patient tolerance: Patient tolerated the procedure well with no immediate complications.  Labs Review Labs Reviewed - No data to display Imaging Review Dg Shoulder Right  05/28/2013   CLINICAL DATA:  Fall.  EXAM: RIGHT SHOULDER - 2+ VIEW  COMPARISON:  None.  FINDINGS: Prior right shoulder replacement. No acute bony abnormality. No fracture, subluxation or dislocation. No hardware complicating feature.  IMPRESSION: Prior right shoulder replacement.  No acute findings.   Electronically Signed   By: Rolm Baptise M.D.   On: 05/28/2013 14:57     EKG Interpretation None      MDM   Final diagnoses:  Fall  Lip laceration  Traumatic fracture of tooth   Pt presenting lip laceration after mechanical fall. She appears in NAD. No LOC. Laceration cleaned, sutured. Has Lissa Merlin type I fracture of upper left front tooth, no loose dentition. Shoulder xray of  right shoulder negative for acute finding. Pt refuses pain medication in ED as she has her own. Stable for d/c, f/u with PCP and dentist. Return precautions given. Patient states understanding of treatment care plan and is agreeable.    Illene Labrador, PA-C 05/28/13 [place], PA-C 05/28/13 [phone]

## 2013-05-28 NOTE — ED Notes (Signed)
Pt states she was at  [place] when she turned and missed a step and fell on her face.

## 2013-08-29 ENCOUNTER — Other Ambulatory Visit: Payer: Self-pay | Admitting: Family Medicine

## 2013-08-29 DIAGNOSIS — N63 Unspecified lump in unspecified breast: Secondary | ICD-10-CM

## 2013-09-30 ENCOUNTER — Ambulatory Visit
Admission: RE | Admit: 2013-09-30 | Discharge: 2013-09-30 | Disposition: A | Discharge status: Home / Self Care | Payer: Medicaid Other | Source: Ambulatory Visit | Attending: Family Medicine | Admitting: Family Medicine

## 2013-09-30 DIAGNOSIS — N63 Unspecified lump in unspecified breast: Secondary | ICD-10-CM

## 2013-10-20 ENCOUNTER — Encounter (HOSPITAL_COMMUNITY): Payer: Self-pay | Admitting: Pharmacy Technician

## 2013-10-24 NOTE — Pre-Procedure Instructions (Signed)
Carmen Strickland  10/24/2013   Your procedure is scheduled on:  Thurs, Aug 13 @ 7:30 AM  Report to [facility]  at 5:30 AM.  Call this number if you have problems the morning of surgery: [phone]   Remember:   Do not eat food or drink liquids after midnight.   Take these medicines the morning of surgery with A SIP OF WATER: Prozac(Fluoxetine),Gabapentin(Neurontin),and Pain Pill(if needed)              Stop taking your Diclofenac. No Goody's,BC's,Aleve,Aspirin,Ibuprofen,Fish Oil,or any Herbal Medications   Do not wear jewelry, make-up or nail polish.  Do not wear lotions, powders, or perfumes.  Do not shave 48 hours prior to surgery.   Do not bring valuables to the hospital.  [facility] is not responsible                  for any belongings or valuables.               Contacts, dentures or bridgework may not be worn into surgery.  Leave suitcase in the car. After surgery it may be brought to your room.  For patients admitted to the hospital, discharge time is determined by your                treatment team.               Patients discharged the day of surgery will not be allowed to drive  home.    Special Instructions:  [place] - Preparing for Surgery  Before surgery, you can play an important role.  Because skin is not sterile, your skin needs to be as free of germs as possible.  You can reduce the number of germs on you skin by washing with CHG (chlorahexidine gluconate) soap before surgery.  CHG is an antiseptic cleaner which kills germs and bonds with the skin to continue killing germs even after washing.  Please DO NOT use if you have an allergy to CHG or antibacterial soaps.  If your skin becomes reddened/irritated stop using the CHG and inform your nurse when you arrive at Short Stay.  Do not shave (including legs and underarms) for at least 48 hours prior to the first CHG shower.  You may shave your face.  Please follow these instructions carefully:   1.   Shower with CHG Soap the night before surgery and the                                morning of Surgery.  2.  If you choose to wash your hair, wash your hair first as usual with your       normal shampoo.  3.  After you shampoo, rinse your hair and body thoroughly to remove the                      Shampoo.  4.  Use CHG as you would any other liquid soap.  You can apply chg directly       to the skin and wash gently with scrungie or a clean washcloth.  5.  Apply the CHG Soap to your body ONLY FROM THE NECK DOWN.        Do not use on open wounds or open sores.  Avoid contact with your eyes,       ears, mouth and genitals (private parts).  Wash genitals (private parts)       with your normal soap.  6.  Wash thoroughly, paying special attention to the area where your surgery        will be performed.  7.  Thoroughly rinse your body with warm water from the neck down.  8.  DO NOT shower/wash with your normal soap after using and rinsing off       the CHG Soap.  9.  Pat yourself dry with a clean towel.            10.  Wear clean pajamas.            11.  Place clean sheets on your bed the night of your first shower and do not        sleep with pets.  Day of Surgery  Do not apply any lotions/deoderants the morning of surgery.  Please wear clean clothes to the hospital/surgery center.     Please read over the following fact sheets that you were given: Pain Booklet, Coughing and Deep Breathing, Blood Transfusion Information and Surgical Site Infection Prevention

## 2013-10-25 ENCOUNTER — Encounter (HOSPITAL_COMMUNITY)
Admission: RE | Admit: 2013-10-25 | Discharge: 2013-10-25 | Disposition: A | Discharge status: Home / Self Care | Payer: Medicaid Other | Source: Ambulatory Visit | Attending: Orthopedic Surgery | Admitting: Orthopedic Surgery

## 2013-10-25 ENCOUNTER — Encounter (HOSPITAL_COMMUNITY): Payer: Self-pay

## 2013-10-25 DIAGNOSIS — Z01812 Encounter for preprocedural laboratory examination: Secondary | ICD-10-CM | POA: Insufficient documentation

## 2013-10-25 DIAGNOSIS — Z01818 Encounter for other preprocedural examination: Secondary | ICD-10-CM | POA: Insufficient documentation

## 2013-10-25 HISTORY — DX: Personal history of other medical treatment: Z92.89

## 2013-10-25 HISTORY — DX: Other chronic pain: G89.29

## 2013-10-25 HISTORY — DX: Carpal tunnel syndrome, unspecified upper limb: G56.00

## 2013-10-25 HISTORY — DX: Pain in unspecified joint: M25.50

## 2013-10-25 HISTORY — DX: Nausea with vomiting, unspecified: R11.2

## 2013-10-25 HISTORY — DX: Effusion, unspecified joint: M25.40

## 2013-10-25 HISTORY — DX: Dorsalgia, unspecified: M54.9

## 2013-10-25 HISTORY — DX: Urgency of urination: R39.15

## 2013-10-25 HISTORY — DX: Other specified postprocedural states: Z98.890

## 2013-10-25 LAB — COMPREHENSIVE METABOLIC PANEL
ALT: 18 U/L (ref 0–35)
AST: 28 U/L (ref 0–37)
Albumin: 4.1 g/dL (ref 3.5–5.2)
Alkaline Phosphatase: 104 U/L (ref 39–117)
Anion gap: 11 (ref 5–15)
BUN: 12 mg/dL (ref 6–23)
CALCIUM: 9.3 mg/dL (ref 8.4–10.5)
CO2: 28 mEq/L (ref 19–32)
Chloride: 104 mEq/L (ref 96–112)
Creatinine, Ser: 0.6 mg/dL (ref 0.50–1.10)
GLUCOSE: 69 mg/dL — AB (ref 70–99)
Potassium: 3.8 mEq/L (ref 3.7–5.3)
Sodium: 143 mEq/L (ref 137–147)
TOTAL PROTEIN: 6.9 g/dL (ref 6.0–8.3)
Total Bilirubin: 0.3 mg/dL (ref 0.3–1.2)

## 2013-10-25 LAB — CBC WITH DIFFERENTIAL/PLATELET
Basophils Absolute: 0 K/uL (ref 0.0–0.1)
Basophils Relative: 1 % (ref 0–1)
Eosinophils Absolute: 0.3 K/uL (ref 0.0–0.7)
Eosinophils Relative: 6 % — ABNORMAL HIGH (ref 0–5)
HCT: 39.3 % (ref 36.0–46.0)
Hemoglobin: 12.6 g/dL (ref 12.0–15.0)
Lymphocytes Relative: 40 % (ref 12–46)
Lymphs Abs: 1.8 K/uL (ref 0.7–4.0)
MCH: 28.2 pg (ref 26.0–34.0)
MCHC: 32.1 g/dL (ref 30.0–36.0)
MCV: 87.9 fL (ref 78.0–100.0)
Monocytes Absolute: 0.4 K/uL (ref 0.1–1.0)
Monocytes Relative: 9 % (ref 3–12)
Neutro Abs: 2 K/uL (ref 1.7–7.7)
Neutrophils Relative %: 44 % (ref 43–77)
Platelets: 210 K/uL (ref 150–400)
RBC: 4.47 MIL/uL (ref 3.87–5.11)
RDW: 14.2 % (ref 11.5–15.5)
WBC: 4.6 K/uL (ref 4.0–10.5)

## 2013-10-25 LAB — APTT: aPTT: 34 s (ref 24–37)

## 2013-10-25 LAB — PROTIME-INR
INR: 1.12 (ref 0.00–1.49)
Prothrombin Time: 14.4 s (ref 11.6–15.2)

## 2013-10-25 MED ORDER — CHLORHEXIDINE GLUCONATE 4 % EX LIQD: 60.0000 mL | Freq: Once | CUTANEOUS | Status: DC

## 2013-10-25 NOTE — Progress Notes (Addendum)
Pt doesn't have a cardiologist  Denies EKG or CXR in past yr  Denies ever having echo or stress test/heart cath  Medical Md is Dr.Elaine Laurann [place]

## 2013-10-25 NOTE — Progress Notes (Signed)
Pt refused to wear the blue bracelet for T&S-aware that she will have to be stuck day of surgery and she is fine with that

## 2013-11-02 MED ORDER — CEFAZOLIN SODIUM-DEXTROSE 2-3 GM-% IV SOLR
2.0000 g | INTRAVENOUS | Status: AC
  Administered 2013-11-03: 2 g via INTRAVENOUS
  Filled 2013-11-02: qty 50

## 2013-11-02 MED ORDER — LACTATED RINGERS IV SOLN
INTRAVENOUS | Status: DC
  Administered 2013-11-03: 07:00:00 via INTRAVENOUS

## 2013-11-03 ENCOUNTER — Ambulatory Visit (HOSPITAL_COMMUNITY): Payer: Medicaid Other | Admitting: Anesthesiology

## 2013-11-03 ENCOUNTER — Encounter (HOSPITAL_COMMUNITY): Payer: Self-pay | Admitting: *Deleted

## 2013-11-03 ENCOUNTER — Inpatient Hospital Stay (HOSPITAL_COMMUNITY)
Admission: RE | Admit: 2013-11-03 | Discharge: 2013-11-04 | DRG: 483 | Disposition: A | Discharge status: Home / Self Care | Payer: Medicaid Other | Source: Ambulatory Visit | Attending: Orthopedic Surgery | Admitting: Orthopedic Surgery

## 2013-11-03 ENCOUNTER — Encounter (HOSPITAL_COMMUNITY): Payer: Medicaid Other | Admitting: Anesthesiology

## 2013-11-03 ENCOUNTER — Encounter (HOSPITAL_COMMUNITY)
Admission: RE | Disposition: A | Discharge status: Home / Self Care | Payer: Self-pay | Source: Ambulatory Visit | Attending: Orthopedic Surgery

## 2013-11-03 DIAGNOSIS — Z96649 Presence of unspecified artificial hip joint: Secondary | ICD-10-CM | POA: Diagnosis not present

## 2013-11-03 DIAGNOSIS — Z96659 Presence of unspecified artificial knee joint: Secondary | ICD-10-CM

## 2013-11-03 DIAGNOSIS — M4120 Other idiopathic scoliosis, site unspecified: Secondary | ICD-10-CM | POA: Diagnosis present

## 2013-11-03 DIAGNOSIS — G8929 Other chronic pain: Secondary | ICD-10-CM | POA: Diagnosis present

## 2013-11-03 DIAGNOSIS — F411 Generalized anxiety disorder: Secondary | ICD-10-CM | POA: Diagnosis present

## 2013-11-03 DIAGNOSIS — M19019 Primary osteoarthritis, unspecified shoulder: Principal | ICD-10-CM | POA: Diagnosis present

## 2013-11-03 DIAGNOSIS — F329 Major depressive disorder, single episode, unspecified: Secondary | ICD-10-CM | POA: Diagnosis present

## 2013-11-03 DIAGNOSIS — Z96619 Presence of unspecified artificial shoulder joint: Secondary | ICD-10-CM | POA: Diagnosis not present

## 2013-11-03 DIAGNOSIS — M810 Age-related osteoporosis without current pathological fracture: Secondary | ICD-10-CM | POA: Diagnosis present

## 2013-11-03 DIAGNOSIS — F3289 Other specified depressive episodes: Secondary | ICD-10-CM | POA: Diagnosis present

## 2013-11-03 DIAGNOSIS — Z79899 Other long term (current) drug therapy: Secondary | ICD-10-CM | POA: Diagnosis not present

## 2013-11-03 HISTORY — PX: TOTAL SHOULDER ARTHROPLASTY: SHX126

## 2013-11-03 LAB — TYPE AND SCREEN
ABO/RH(D): O POS
Antibody Screen: NEGATIVE

## 2013-11-03 SURGERY — ARTHROPLASTY, SHOULDER, TOTAL
Anesthesia: Regional | Site: Shoulder | Laterality: Left

## 2013-11-03 MED ORDER — 0.9 % SODIUM CHLORIDE (POUR BTL) OPTIME
TOPICAL | Status: DC | PRN
  Administered 2013-11-03: 1000 mL

## 2013-11-03 MED ORDER — MIDAZOLAM HCL 2 MG/2ML IJ SOLN
INTRAMUSCULAR | Status: AC
  Filled 2013-11-03: qty 2

## 2013-11-03 MED ORDER — OXYCODONE HCL 5 MG PO TABS: 5.0000 mg | ORAL_TABLET | Freq: Once | ORAL | Status: DC | PRN

## 2013-11-03 MED ORDER — NEOSTIGMINE METHYLSULFATE 10 MG/10ML IV SOLN
INTRAVENOUS | Status: DC | PRN
  Administered 2013-11-03: 2.5 mg via INTRAVENOUS

## 2013-11-03 MED ORDER — LIDOCAINE HCL (CARDIAC) 20 MG/ML IV SOLN
INTRAVENOUS | Status: DC | PRN
  Administered 2013-11-03: 40 mg via INTRAVENOUS

## 2013-11-03 MED ORDER — ACETAMINOPHEN 160 MG/5ML PO SOLN
325.0000 mg | ORAL | Status: DC | PRN
  Filled 2013-11-03: qty 20.3

## 2013-11-03 MED ORDER — GABAPENTIN 300 MG PO CAPS
300.0000 mg | ORAL_CAPSULE | Freq: Three times a day (TID) | ORAL | Status: DC
  Administered 2013-11-03 – 2013-11-04 (×2): 300 mg via ORAL
  Filled 2013-11-03 (×4): qty 1

## 2013-11-03 MED ORDER — FLUOXETINE HCL 20 MG PO CAPS
60.0000 mg | ORAL_CAPSULE | Freq: Every day | ORAL | Status: DC
  Filled 2013-11-03: qty 3

## 2013-11-03 MED ORDER — ONDANSETRON HCL 4 MG/2ML IJ SOLN
INTRAMUSCULAR | Status: DC | PRN
  Administered 2013-11-03: 4 mg via INTRAVENOUS

## 2013-11-03 MED ORDER — ACETAMINOPHEN 325 MG PO TABS: 650.0000 mg | ORAL_TABLET | Freq: Four times a day (QID) | ORAL | Status: DC | PRN

## 2013-11-03 MED ORDER — DOCUSATE SODIUM 100 MG PO CAPS
100.0000 mg | ORAL_CAPSULE | Freq: Two times a day (BID) | ORAL | Status: DC
  Administered 2013-11-03 – 2013-11-04 (×2): 100 mg via ORAL
  Filled 2013-11-03 (×3): qty 1

## 2013-11-03 MED ORDER — POLYETHYLENE GLYCOL 3350 17 G PO PACK: 17.0000 g | PACK | Freq: Every day | ORAL | Status: DC | PRN

## 2013-11-03 MED ORDER — KETOROLAC TROMETHAMINE 15 MG/ML IJ SOLN
15.0000 mg | Freq: Four times a day (QID) | INTRAMUSCULAR | Status: DC
  Administered 2013-11-03 – 2013-11-04 (×3): 15 mg via INTRAVENOUS
  Filled 2013-11-03 (×7): qty 1

## 2013-11-03 MED ORDER — FENTANYL CITRATE 0.05 MG/ML IJ SOLN
INTRAMUSCULAR | Status: DC | PRN
  Administered 2013-11-03 (×2): 50 ug via INTRAVENOUS

## 2013-11-03 MED ORDER — ONDANSETRON HCL 4 MG/2ML IJ SOLN
INTRAMUSCULAR | Status: AC
  Filled 2013-11-03: qty 2

## 2013-11-03 MED ORDER — ROCURONIUM BROMIDE 50 MG/5ML IV SOLN
INTRAVENOUS | Status: AC
Start: 2013-11-03 — End: 2013-11-03
  Filled 2013-11-03: qty 1

## 2013-11-03 MED ORDER — DIAZEPAM 5 MG PO TABS
5.0000 mg | ORAL_TABLET | Freq: Every evening | ORAL | Status: DC | PRN
  Administered 2013-11-03: 5 mg via ORAL
  Filled 2013-11-03: qty 1

## 2013-11-03 MED ORDER — ONDANSETRON HCL 4 MG/2ML IJ SOLN: 4.0000 mg | Freq: Four times a day (QID) | INTRAMUSCULAR | Status: DC | PRN

## 2013-11-03 MED ORDER — HYDROMORPHONE HCL 2 MG PO TABS
2.0000 mg | ORAL_TABLET | ORAL | Status: DC | PRN
  Administered 2013-11-04: 4 mg via ORAL
  Administered 2013-11-04 (×2): 2 mg via ORAL
  Filled 2013-11-03: qty 1
  Filled 2013-11-03: qty 2
  Filled 2013-11-03: qty 1

## 2013-11-03 MED ORDER — DIPHENHYDRAMINE HCL 12.5 MG/5ML PO ELIX: 12.5000 mg | ORAL_SOLUTION | ORAL | Status: DC | PRN

## 2013-11-03 MED ORDER — LACTATED RINGERS IV SOLN
INTRAVENOUS | Status: DC
  Administered 2013-11-03 – 2013-11-04 (×2): via INTRAVENOUS

## 2013-11-03 MED ORDER — MIDAZOLAM HCL 5 MG/5ML IJ SOLN
INTRAMUSCULAR | Status: DC | PRN
  Administered 2013-11-03: 2 mg via INTRAVENOUS

## 2013-11-03 MED ORDER — SODIUM CHLORIDE 0.9 % IV SOLN
10.0000 mg | INTRAVENOUS | Status: DC | PRN
  Administered 2013-11-03: 10 ug/min via INTRAVENOUS

## 2013-11-03 MED ORDER — FLEET ENEMA 7-19 GM/118ML RE ENEM: 1.0000 | ENEMA | Freq: Once | RECTAL | Status: AC | PRN

## 2013-11-03 MED ORDER — PROPOFOL 10 MG/ML IV BOLUS
INTRAVENOUS | Status: DC | PRN
  Administered 2013-11-03: 130 mg via INTRAVENOUS

## 2013-11-03 MED ORDER — OXYCODONE HCL 5 MG/5ML PO SOLN: 5.0000 mg | Freq: Once | ORAL | Status: DC | PRN

## 2013-11-03 MED ORDER — MENTHOL 3 MG MT LOZG: 1.0000 | LOZENGE | OROMUCOSAL | Status: DC | PRN

## 2013-11-03 MED ORDER — PHENOL 1.4 % MT LIQD: 1.0000 | OROMUCOSAL | Status: DC | PRN

## 2013-11-03 MED ORDER — FENTANYL CITRATE 0.05 MG/ML IJ SOLN
INTRAMUSCULAR | Status: AC
Start: 2013-11-03 — End: 2013-11-03
  Filled 2013-11-03: qty 5

## 2013-11-03 MED ORDER — METOCLOPRAMIDE HCL 5 MG/ML IJ SOLN: 5.0000 mg | Freq: Three times a day (TID) | INTRAMUSCULAR | Status: DC | PRN

## 2013-11-03 MED ORDER — BISACODYL 5 MG PO TBEC: 5.0000 mg | DELAYED_RELEASE_TABLET | Freq: Every day | ORAL | Status: DC | PRN

## 2013-11-03 MED ORDER — GLYCOPYRROLATE 0.2 MG/ML IJ SOLN
INTRAMUSCULAR | Status: DC | PRN
  Administered 2013-11-03: .5 mg via INTRAVENOUS

## 2013-11-03 MED ORDER — KETOROLAC TROMETHAMINE 30 MG/ML IJ SOLN: 15.0000 mg | Freq: Once | INTRAMUSCULAR | Status: DC | PRN

## 2013-11-03 MED ORDER — METOCLOPRAMIDE HCL 10 MG PO TABS: 5.0000 mg | ORAL_TABLET | Freq: Three times a day (TID) | ORAL | Status: DC | PRN

## 2013-11-03 MED ORDER — FENTANYL 25 MCG/HR TD PT72
25.0000 ug | MEDICATED_PATCH | TRANSDERMAL | Status: DC
  Administered 2013-11-04: 25 ug via TRANSDERMAL
  Filled 2013-11-03: qty 1

## 2013-11-03 MED ORDER — ACETAMINOPHEN 650 MG RE SUPP: 650.0000 mg | Freq: Four times a day (QID) | RECTAL | Status: DC | PRN

## 2013-11-03 MED ORDER — ROCURONIUM BROMIDE 100 MG/10ML IV SOLN
INTRAVENOUS | Status: DC | PRN
  Administered 2013-11-03: 50 mg via INTRAVENOUS

## 2013-11-03 MED ORDER — HYDROMORPHONE HCL PF 1 MG/ML IJ SOLN: 0.2500 mg | INTRAMUSCULAR | Status: DC | PRN

## 2013-11-03 MED ORDER — PROPOFOL 10 MG/ML IV BOLUS
INTRAVENOUS | Status: AC
  Filled 2013-11-03: qty 20

## 2013-11-03 MED ORDER — ACETAMINOPHEN 325 MG PO TABS: 325.0000 mg | ORAL_TABLET | ORAL | Status: DC | PRN

## 2013-11-03 MED ORDER — SUCCINYLCHOLINE CHLORIDE 20 MG/ML IJ SOLN
INTRAMUSCULAR | Status: AC
  Filled 2013-11-03: qty 1

## 2013-11-03 MED ORDER — TIZANIDINE HCL 4 MG PO TABS
4.0000 mg | ORAL_TABLET | Freq: Four times a day (QID) | ORAL | Status: DC | PRN
  Filled 2013-11-03: qty 1

## 2013-11-03 MED ORDER — CEFAZOLIN SODIUM 1-5 GM-% IV SOLN
1.0000 g | Freq: Four times a day (QID) | INTRAVENOUS | Status: AC
  Administered 2013-11-03 – 2013-11-04 (×3): 1 g via INTRAVENOUS
  Filled 2013-11-03 (×3): qty 50

## 2013-11-03 MED ORDER — ONDANSETRON HCL 4 MG PO TABS: 4.0000 mg | ORAL_TABLET | Freq: Four times a day (QID) | ORAL | Status: DC | PRN

## 2013-11-03 MED ORDER — ALUM & MAG HYDROXIDE-SIMETH 200-200-20 MG/5ML PO SUSP: 30.0000 mL | ORAL | Status: DC | PRN

## 2013-11-03 MED ORDER — LIDOCAINE HCL (CARDIAC) 20 MG/ML IV SOLN
INTRAVENOUS | Status: AC
  Filled 2013-11-03: qty 5

## 2013-11-03 MED ORDER — GLYCOPYRROLATE 0.2 MG/ML IJ SOLN
INTRAMUSCULAR | Status: AC
  Filled 2013-11-03: qty 2

## 2013-11-03 SURGICAL SUPPLY — 66 items
BLADE SAW SGTL 83.5X18.5 (BLADE) ×3 IMPLANT
CEMENT BONE DEPUY (Cement) ×3 IMPLANT
CLOSURE WOUND 1/2 X4 (GAUZE/BANDAGES/DRESSINGS) ×1
COVER SURGICAL LIGHT HANDLE (MISCELLANEOUS) ×3 IMPLANT
DERMABOND ADVANCED (GAUZE/BANDAGES/DRESSINGS) ×2
DERMABOND ADVANCED .7 DNX12 (GAUZE/BANDAGES/DRESSINGS) ×1 IMPLANT
DRAPE INCISE IOBAN 66X45 STRL (DRAPES) ×3 IMPLANT
DRAPE SURG 17X11 SM STRL (DRAPES) ×3 IMPLANT
DRAPE SURG 17X23 STRL (DRAPES) ×3 IMPLANT
DRAPE U-SHAPE 47X51 STRL (DRAPES) ×3 IMPLANT
DRILL BIT 7/64X5 (BIT) IMPLANT
DRSG AQUACEL AG ADV 3.5X10 (GAUZE/BANDAGES/DRESSINGS) ×3 IMPLANT
DRSG MEPILEX BORDER 4X4 (GAUZE/BANDAGES/DRESSINGS) ×3 IMPLANT
DRSG MEPILEX BORDER 4X8 (GAUZE/BANDAGES/DRESSINGS) ×3 IMPLANT
DURAPREP 26ML APPLICATOR (WOUND CARE) ×6 IMPLANT
ELECT BLADE 4.0 EZ CLEAN MEGAD (MISCELLANEOUS) ×3
ELECT CAUTERY BLADE 6.4 (BLADE) ×3 IMPLANT
ELECT REM PT RETURN 9FT ADLT (ELECTROSURGICAL) ×3
ELECTRODE BLDE 4.0 EZ CLN MEGD (MISCELLANEOUS) ×1 IMPLANT
ELECTRODE REM PT RTRN 9FT ADLT (ELECTROSURGICAL) ×1 IMPLANT
FACESHIELD WRAPAROUND (MASK) ×9 IMPLANT
GLENOID ANCHOR PEG CROSSLK 44 (Orthopedic Implant) ×3 IMPLANT
GLOVE BIO SURGEON STRL SZ7.5 (GLOVE) ×3 IMPLANT
GLOVE BIO SURGEON STRL SZ8 (GLOVE) ×3 IMPLANT
GLOVE EUDERMIC 7 POWDERFREE (GLOVE) ×3 IMPLANT
GLOVE SS BIOGEL STRL SZ 7.5 (GLOVE) ×1 IMPLANT
GLOVE SUPERSENSE BIOGEL SZ 7.5 (GLOVE) ×2
GOWN STRL REUS W/ TWL LRG LVL3 (GOWN DISPOSABLE) ×2 IMPLANT
GOWN STRL REUS W/ TWL XL LVL3 (GOWN DISPOSABLE) ×3 IMPLANT
GOWN STRL REUS W/TWL LRG LVL3 (GOWN DISPOSABLE) ×4
GOWN STRL REUS W/TWL XL LVL3 (GOWN DISPOSABLE) ×6
HEAD HUM STD 44X18 STRL (Trauma) ×3 IMPLANT
HUMERAL STEM 10MM (Trauma) ×3 IMPLANT
KIT BASIN OR (CUSTOM PROCEDURE TRAY) ×3 IMPLANT
KIT ROOM TURNOVER OR (KITS) ×3 IMPLANT
MANIFOLD NEPTUNE II (INSTRUMENTS) ×3 IMPLANT
NDL SUT 6 .5 CRC .975X.05 MAYO (NEEDLE) ×1 IMPLANT
NEEDLE HYPO 25GX1X1/2 BEV (NEEDLE) ×3 IMPLANT
NEEDLE MAYO TAPER (NEEDLE) ×2
NS IRRIG 1000ML POUR BTL (IV SOLUTION) ×3 IMPLANT
PACK SHOULDER (CUSTOM PROCEDURE TRAY) ×3 IMPLANT
PAD ARMBOARD 7.5X6 YLW CONV (MISCELLANEOUS) ×6 IMPLANT
PASSER SUT SWANSON 36MM LOOP (INSTRUMENTS) ×3 IMPLANT
SLING ARM LRG ADULT FOAM STRAP (SOFTGOODS) ×3 IMPLANT
SLING ARM XL FOAM STRAP (SOFTGOODS) ×3 IMPLANT
SMARTMIX MINI TOWER (MISCELLANEOUS) ×3
SPONGE LAP 18X18 X RAY DECT (DISPOSABLE) ×3 IMPLANT
SPONGE LAP 4X18 X RAY DECT (DISPOSABLE) ×3 IMPLANT
STEM HUMERAL 10MM (Trauma) ×1 IMPLANT
STRIP CLOSURE SKIN 1/2X4 (GAUZE/BANDAGES/DRESSINGS) ×2 IMPLANT
SUCTION FRAZIER TIP 10 FR DISP (SUCTIONS) ×3 IMPLANT
SUT BONE WAX W31G (SUTURE) IMPLANT
SUT FIBERWIRE #2 38 T-5 BLUE (SUTURE) ×12
SUT MNCRL AB 3-0 PS2 18 (SUTURE) ×3 IMPLANT
SUT VIC AB 0 CT1 27 (SUTURE) ×2
SUT VIC AB 0 CT1 27XBRD ANBCTR (SUTURE) ×1 IMPLANT
SUT VIC AB 1 CT1 27 (SUTURE) ×2
SUT VIC AB 1 CT1 27XBRD ANBCTR (SUTURE) ×1 IMPLANT
SUT VIC AB 2-0 CT1 27 (SUTURE) ×4
SUT VIC AB 2-0 CT1 TAPERPNT 27 (SUTURE) ×2 IMPLANT
SUTURE FIBERWR #2 38 T-5 BLUE (SUTURE) ×4 IMPLANT
SYR CONTROL 10ML LL (SYRINGE) ×3 IMPLANT
TOWEL OR 17X24 6PK STRL BLUE (TOWEL DISPOSABLE) ×3 IMPLANT
TOWEL OR 17X26 10 PK STRL BLUE (TOWEL DISPOSABLE) ×3 IMPLANT
TOWER SMARTMIX MINI (MISCELLANEOUS) ×1 IMPLANT
WATER STERILE IRR 1000ML POUR (IV SOLUTION) ×3 IMPLANT

## 2013-11-03 NOTE — OR Nursing (Signed)
Patient in the room time corrected.

## 2013-11-03 NOTE — Op Note (Signed)
11/03/2013  9:44 AM  PATIENT:   Carmen Strickland  48 y.o. female  PRE-OPERATIVE DIAGNOSIS: end stage left shoulder osteoarthritis  POST-OPERATIVE DIAGNOSIS:  same  PROCEDURE:  L TSA #10 stem, 44x18 standard head, 44 glenoid  SURGEON:  Foday Cone, Metta Clines M.D.  ASSISTANTS: Shuford pac   ANESTHESIA:   GET + ISB  EBL: 250  SPECIMEN:  none  Drains: none   PATIENT DISPOSITION:  PACU - hemodynamically stable.    PLAN OF CARE: Admit to inpatient   Dictation# [id number]

## 2013-11-03 NOTE — Progress Notes (Signed)
Utilization review completed.  

## 2013-11-03 NOTE — Anesthesia Preprocedure Evaluation (Addendum)
Anesthesia Evaluation  Patient identified by MRN, date of birth, ID band Patient awake    Reviewed: Allergy & Precautions, H&P , NPO status , Patient's Chart, lab work & pertinent test results  History of Anesthesia Complications Negative for: history of anesthetic complications  Airway Mallampati: II TM Distance: >3 FB Neck ROM: Full    Dental  (+) Teeth Intact   Pulmonary neg pulmonary ROS,  breath sounds clear to auscultation        Cardiovascular Rhythm:Regular     Neuro/Psych PSYCHIATRIC DISORDERS Anxiety Depression Chronic back pain, narcotic dependent  Neuromuscular disease    GI/Hepatic negative GI ROS, Neg liver ROS,   Endo/Other  negative endocrine ROS  Renal/GU negative Renal ROS     Musculoskeletal  (+) Arthritis -, Osteoarthritis,    Abdominal   Peds  Hematology negative hematology ROS (+)   Anesthesia Other Findings   Reproductive/Obstetrics                          Anesthesia Physical Anesthesia Plan  ASA: II  Anesthesia Plan: General and Regional   Post-op Pain Management:    Induction: Intravenous  Airway Management Planned: Oral ETT  Additional Equipment: None  Intra-op Plan:   Post-operative Plan: Extubation in OR  Informed Consent: I have reviewed the patients History and Physical, chart, labs and discussed the procedure including the risks, benefits and alternatives for the proposed anesthesia with the patient or authorized representative who has indicated his/her understanding and acceptance.   Dental advisory given  Plan Discussed with: CRNA and Surgeon  Anesthesia Plan Comments:         Anesthesia Quick Evaluation

## 2013-11-03 NOTE — Anesthesia Postprocedure Evaluation (Signed)
  Anesthesia Post-op Note  Patient: Carmen Strickland  Procedure(s) Performed: Procedure(s): LEFT TOTAL SHOULDER ARTHROPLASTY (Left)  Patient Location: PACU  Anesthesia Type:General and Regional  Level of Consciousness: awake  Airway and Oxygen Therapy: Patient Spontanous Breathing  Post-op Pain: none  Post-op Assessment: Post-op Vital signs reviewed, Patient's Cardiovascular Status Stable, Respiratory Function Stable, Patent Airway, No signs of Nausea or vomiting and Pain level controlled  Post-op Vital Signs: Reviewed and stable  Last Vitals:  Filed Vitals:   11/03/13 1245  BP: 118/63  Pulse: 66  Temp:   Resp: 15    Complications: No apparent anesthesia complications

## 2013-11-03 NOTE — H&P (Signed)
Carmen Strickland    Chief Complaint: left shoulder osteoarthritis HPI: The patient is a 48 y.o. female with end stage left shoulder OA  Past Medical History  Diagnosis Date  . Arthritis   . Osteoporosis   . Anxiety   . Developmental displacement hip   . Depression     takes Prozac daily  . Complication of anesthesia     pt states easily sedated   . PONV (postoperative nausea and vomiting)     as a child  . Carpal tunnel syndrome   . Joint pain   . Joint swelling   . Chronic back pain     scoliosis and stenosis. Buldging disc  . Urinary urgency   . History of blood transfusion     no abnormal reaction noted    Past Surgical History  Procedure Laterality Date  . Total shoulder replacement Right 03/03/2013    DR Mozella Rexrode   . Total shoulder arthroplasty Right 03/03/2013    Procedure: RIGHT TOTAL SHOULDER ARTHROPLASTY;  Surgeon: Marin Shutter, MD;  Location: [facility];  Service: Orthopedics;  Laterality: Right;  . Right knee surgery  at age 49  . Joint replacement      hips/knees    x4  . Hip surgery      x 9 as a child  . Carpal tunnel release Right   . Cyst removed from left breast      Family History  Problem Relation Age of Onset  . Multiple sclerosis Mother     Social History:  reports that she has never smoked. She has never used smokeless tobacco. She reports that she drinks alcohol. She reports that she does not use illicit drugs.  Allergies: No Known Allergies  Medications Prior to Admission  Medication Sig Dispense Refill  . Ascorbic Acid (VITAMIN C) 1000 MG tablet Take 1,000 mg by mouth daily.      . diclofenac (VOLTAREN) 75 MG EC tablet Take 75 mg by mouth 2 (two) times daily.      . fentaNYL (DURAGESIC - DOSED MCG/HR) 25 MCG/HR patch Place 25 mcg onto the skin every 3 (three) days.      Marland Kitchen FLUoxetine (PROZAC) 20 MG capsule Take 60 mg by mouth daily.      Marland Kitchen gabapentin (NEURONTIN) 300 MG capsule Take 300 mg by mouth 3 (three) times daily.      Marland Kitchen  oxyCODONE-acetaminophen (PERCOCET) 10-325 MG per tablet Take 1 tablet by mouth every 4 (four) hours as needed for pain.      Marland Kitchen pyridOXINE (VITAMIN B-6) 100 MG tablet Take 200 mg by mouth daily.      Marland Kitchen tiZANidine (ZANAFLEX) 4 MG tablet Take 4 mg by mouth every 6 (six) hours as needed for muscle spasms.         Physical Exam: left shoulder with painful and restricted motion as noted at recent office visits  Vitals  Temp:  [98.5 F (36.9 C)] 98.5 F (36.9 C) (08/13 6387) Pulse Rate:  [66] 66 (08/13 0613) Resp:  [20] 20 (08/13 0613) BP: (139)/(76) 139/76 mmHg (08/13 0613) SpO2:  [97 %] 97 % (08/13 0613) Weight:  [86.047 kg (189 lb 11.2 oz)] 86.047 kg (189 lb 11.2 oz) (08/13 5643)  Assessment/Plan  Impression: left shoulder osteoarthritis  Plan of Action: Procedure(s): LEFT TOTAL SHOULDER ARTHROPLASTY  Cleatis Fandrich M 11/03/2013, 7:24 AM

## 2013-11-03 NOTE — Anesthesia Procedure Notes (Signed)
Procedure Name: Intubation Date/Time: 11/03/2013 7:40 AM Performed by: Kyung Rudd Pre-anesthesia Checklist: Patient identified, Emergency Drugs available, Suction available, Patient being monitored and Timeout performed Patient Re-evaluated:Patient Re-evaluated prior to inductionOxygen Delivery Method: Circle system utilized Preoxygenation: Pre-oxygenation with 100% oxygen Intubation Type: IV induction Ventilation: Mask ventilation without difficulty Laryngoscope Size: Mac and 3 Grade View: Grade I Tube type: Oral Tube size: 7.0 mm Number of attempts: 1 Airway Equipment and Method: Stylet Placement Confirmation: ETT inserted through vocal cords under direct vision,  positive ETCO2 and breath sounds checked- equal and bilateral Secured at: 21 cm Tube secured with: Tape Dental Injury: Teeth and Oropharynx as per pre-operative assessment

## 2013-11-03 NOTE — Transfer of Care (Signed)
Immediate Anesthesia Transfer of Care Note  Patient: Carmen Strickland  Procedure(s) Performed: Procedure(s): LEFT TOTAL SHOULDER ARTHROPLASTY (Left)  Patient Location: PACU  Anesthesia Type:General  Level of Consciousness: awake, alert  and oriented  Airway & Oxygen Therapy: Patient Spontanous Breathing and Patient connected to nasal cannula oxygen  Post-op Assessment: Report given to PACU RN, Post -op Vital signs reviewed and stable and Patient moving all extremities X 4  Post vital signs: Reviewed and stable  Complications: No apparent anesthesia complications

## 2013-11-03 NOTE — Discharge Instructions (Signed)
° °  Kevin M. Supple, M.D., F.A.A.O.S. °Orthopaedic Surgery °Specializing in Arthroscopic and Reconstructive °Surgery of the Shoulder and Knee °[phone] °[address] - [place], [place] [address] - Fax [phone] ° ° °POST-OP TOTAL SHOULDER REPLACEMENT/SHOULDER HEMIARTHROPLASTY INSTRUCTIONS ° °1. Call the office at [phone] to schedule your first post-op appointment 10-14 days from the date of your surgery. ° °2. The bandage over your incision is waterproof. You may begin showering with this dressing on. You may leave this dressing on until first follow up appointment within 2 weeks. If you would like to remove it you may do so after the 5th day. Go slow and tug at the borders gently to break the bond the dressing has with the skin. The steri strips may come off with the dressing. At this point if there is no drainage it is okay to go without a bandage or you may cover it with a light guaze and tape. Leave the steri-strips in place over your incision. You can expect drainage that is bloody or yellow in nature that should gradually decrease from day of surgery. Change your dressing daily until drainage is completely resolved, then you may feel free to go without a bandage. You can also expect significant bruising around your shoulder that will drift down your arm and into your chest wall. This is very normal and should resolve over several days. ° ° 3. Wear your sling/immobilizer at all times except to perform the exercises below or to occasionally let your arm dangle by your side to stretch your elbow. You also need to sleep in your sling immobilizer until instructed otherwise. ° °4. Range of motion to your elbow, wrist, and hand are encouraged 3-5 times daily. Exercise to your hand and fingers helps to reduce swelling you may experience. ° °5. Utilize ice to the shoulder 3-5 times minimum a day and additionally if you are experiencing pain. ° °6. Prescriptions for a pain medication and a muscle  relaxant are provided for you. It is recommended that if you are experiencing pain that you pain medication alone is not controlling, add the muscle relaxant along with the pain medication which can give additional pain relief. The first 1-2 days is generally the most severe of your pain and then should gradually decrease. As your pain lessens it is recommended that you decrease your use of the pain medications to an "as needed basis'" only and to always comply with the recommended dosages of the pain medications. ° °7. Pain medications can produce constipation along with their use. If you experience this, the use of an over the counter stool softener or laxative daily is recommended.  ° °8. For most patients, if insurance allows, home health services to include therapy has been arranged. ° °9. For additional questions or concerns, please do not hesitate to call the office. If after hours there is an answering service to forward your concerns to the physician on call. ° °POST-OP EXERCISES ° °Pendulum Exercises ° °Perform pendulum exercises while standing and bending at the waist. Support your uninvolved arm on a table or chair and allow your operated arm to hang freely. Make sure to do these exercises passively - not using you shoulder muscles. ° °Repeat 20 times. Do 3 sessions per day. ° ° ° ° °

## 2013-11-03 NOTE — Progress Notes (Signed)
Report given to Mary Ann RN.

## 2013-11-04 ENCOUNTER — Encounter (HOSPITAL_COMMUNITY): Payer: Self-pay | Admitting: Orthopedic Surgery

## 2013-11-04 MED ORDER — DIAZEPAM 5 MG PO TABS: 2.5000 mg | ORAL_TABLET | Freq: Four times a day (QID) | ORAL | Status: DC | PRN

## 2013-11-04 MED ORDER — HYDROMORPHONE HCL 2 MG PO TABS: 2.0000 mg | ORAL_TABLET | ORAL | Status: DC | PRN

## 2013-11-04 NOTE — Progress Notes (Signed)
Occupational Therapy Evaluation Patient Details Name: Carmen Strickland MRN: [id number] DOB: 06-Mar-1966 Today's Date: 11/04/2013    History of Present Illness Carmen Strickland is a 48 y.o. Female s/p Lt Total Shoulder Replacement on 11/03/13. Pt with hx of Rt TSA in December 2014 as well as Bil Carpal tunnel s/p Rt release.    Clinical Impression   PTA pt lived at home and was independent with ADLs and functional mobility. Pt is s/p Rt TSA last December and familiar with compensatory techniques. Reviewed UB ADLs using compensatory techniques and educated pt on sling schedule and donning/doffing. Pt performed therapeutic exercise with OT assist for shoulder PROM and elbow, wrist, hand AROM as well as pendulums. Pt ready for d/c from OT standpoint.    Follow Up Recommendations  Supervision/Assistance - 24 hour    Equipment Recommendations  None recommended by OT       Precautions / Restrictions Precautions Precautions: Shoulder Type of Shoulder Precautions: Supple TSA: No AROM of shoulder. PROM of ER, FF, ABD; pendulums; E,W,H exercises Shoulder Interventions: For comfort;Shoulder sling/immobilizer (and sleep) Precaution Booklet Issued: Yes (comment) Precaution Comments: Educated pt on shoulder precautions and incorporating into ADLs Required Braces or Orthoses: Sling Restrictions Weight Bearing Restrictions: Yes LUE Weight Bearing: Non weight bearing      Mobility Bed Mobility               General bed mobility comments: Pt up and moving around room when OT arrived.   Transfers Overall transfer level: Needs assistance Equipment used: None Transfers: Sit to/from Stand Sit to Stand: Min guard         General transfer comment: Min guard due to imbalance likely from pain medication.          ADL Overall ADL's : Needs assistance/impaired Eating/Feeding: Independent;Sitting   Grooming: Standing;Supervision/safety   Upper Body Bathing: Set up;Supervision/  safety;Sitting;Standing   Lower Body Bathing: Supervison/ safety;Set up;Sit to/from stand   Upper Body Dressing : Minimal assistance;Sitting   Lower Body Dressing: Minimal assistance;Sit to/from stand   Toilet Transfer: Min guard;Ambulation   Toileting- Clothing Manipulation and Hygiene: Min guard;Sit to/from stand   Tub/ Shower Transfer: Min guard   Functional mobility during ADLs: Min guard General ADL Comments: Pt required min guard (A) for ambulation likely due to medications. Pt performed wash up at sink and dressed to prepare for D/C. Pt performed therapeutic exercise with OT assist.      Vision  Pt wears glasses at all times and reports no change from baseline.  No apparent visual deficits.                  Perception Perception Perception Tested?: No   Praxis Praxis Praxis tested?: Within functional limits    Pertinent Vitals/Pain Pain Assessment: 0-10 Pain Score: 6  Pain Location: Lt shoulder Pain Descriptors / Indicators: Aching;Sore Pain Intervention(s): Limited activity within patient's tolerance;Monitored during session;Premedicated before session;Ice applied;Repositioned     Hand Dominance Left   Extremity/Trunk Assessment Upper Extremity Assessment Upper Extremity Assessment: LUE deficits/detail LUE Deficits / Details: No shoulder AROM LUE: Unable to fully assess due to immobilization LUE Coordination: decreased gross motor;decreased fine motor   Lower Extremity Assessment Lower Extremity Assessment: Overall WFL for tasks assessed   Cervical / Trunk Assessment Cervical / Trunk Assessment: Normal   Communication Communication Communication: No difficulties   Cognition Arousal/Alertness: Awake/alert Behavior During Therapy: WFL for tasks assessed/performed Overall Cognitive Status: Within Functional Limits for tasks assessed  Exercises Exercises: Shoulder     Shoulder Instructions Shoulder  Instructions Donning/doffing shirt without moving shoulder: Minimal assistance Method for sponge bathing under operated UE: Set-up;Supervision/safety Donning/doffing sling/immobilizer: Minimal assistance Correct positioning of sling/immobilizer: Supervision/safety;Set-up Pendulum exercises (written home exercise program): Supervision/safety ROM for elbow, wrist and digits of operated UE: Independent Sling wearing schedule (on at all times/off for ADL's): Independent Proper positioning of operated UE when showering: Independent Positioning of UE while sleeping: [place] expects to be discharged to:: Private residence Living Arrangements: Spouse/significant other Available Help at Discharge: Available PRN/intermittently;Family Type of Home: House Home Access: Stairs to enter [employer] of Steps: 1 STE   Home Layout: One level     Bathroom Shower/Tub: Teacher, early years/pre: Handicapped height     Home Equipment: Environmental consultant - 2 wheels;Cane - single point;Wheelchair - Education administrator (comment)          Prior Functioning/Environment Level of Independence: Independent                                       End of Session Equipment Utilized During Treatment: Other (comment) (sling) Nurse Communication: Other (comment) (pt ready for d/c from OT standpoint)  Activity Tolerance: Patient tolerated treatment well Patient left: in chair;with call bell/phone within reach   Time: 0881-1031 OT Time Calculation (min): 52 min Charges:  OT General Charges $OT Visit: 1 Procedure OT Evaluation $Initial OT Evaluation Tier I: 1 Procedure OT Treatments $Self Care/Home Management : 23-37 mins $Therapeutic Exercise: 8-22 mins  Juluis Rainier [phone] 11/04/2013, 10:30 AM

## 2013-11-04 NOTE — Discharge Summary (Signed)
PATIENT ID:      Carmen Strickland  MRN:     [id number] DOB/AGE:    [birth year] / 48 y.o.     DISCHARGE SUMMARY  ADMISSION DATE:    11/03/2013 DISCHARGE DATE:    ADMISSION DIAGNOSIS: left shoulder osteoarthritis Past Medical History  Diagnosis Date  . Arthritis   . Osteoporosis   . Anxiety   . Developmental displacement hip   . Depression     takes Prozac daily  . Complication of anesthesia     pt states easily sedated   . PONV (postoperative nausea and vomiting)     as a child  . Carpal tunnel syndrome   . Joint pain   . Joint swelling   . Chronic back pain     scoliosis and stenosis. Buldging disc  . Urinary urgency   . History of blood transfusion     no abnormal reaction noted    DISCHARGE DIAGNOSIS:   Active Problems:   S/P shoulder replacement   PROCEDURE: Procedure(s): LEFT TOTAL SHOULDER ARTHROPLASTY on 11/03/2013  CONSULTS:     HISTORY:  See H&P in chart.  HOSPITAL COURSE:  Carmen Strickland is a 48 y.o. admitted on 11/03/2013 with a chief complaint of left shoulder pain and dysfunction, and found to have a diagnosis of left shoulder osteoarthritis.  They were brought to the operating room on 11/03/2013 and underwent Procedure(s): LEFT TOTAL SHOULDER ARTHROPLASTY.    They were given perioperative antibiotics: Anti-infectives   Start     Dose/Rate Route Frequency Ordered Stop   11/03/13 1600  ceFAZolin (ANCEF) IVPB 1 g/50 mL premix     1 g 100 mL/hr over 30 Minutes Intravenous Every 6 hours 11/03/13 1450 11/04/13 0330   11/03/13 0600  ceFAZolin (ANCEF) IVPB 2 g/50 mL premix     2 g 100 mL/hr over 30 Minutes Intravenous On call to O.R. 11/02/13 1446 11/03/13 0800    .  Patient underwent the above named procedure and tolerated it well. Pt had done well with previous right shoulder replacement and this time was easier. She also reports much better relief with the blockThe following day they were hemodynamically stable and pain was controlled on oral analgesics.  They were neurovascularly intact to the operative extremity. OT was ordered and worked with patient per protocol. They were medically and orthopaedically stable for discharge on day 1.     DIAGNOSTIC STUDIES:  RECENT RADIOGRAPHIC STUDIES :  No results found.  RECENT VITAL SIGNS:  Patient Vitals for the past 24 hrs:  BP Temp Temp src Pulse Resp SpO2  11/04/13 0439 126/65 mmHg 98 F (36.7 C) Oral 70 16 97 %  11/04/13 0020 108/60 mmHg 98.2 F (36.8 C) Oral 74 16 95 %  11/03/13 2049 129/72 mmHg 98.5 F (36.9 C) Oral 95 18 96 %  11/03/13 1639 117/61 mmHg - - 68 18 97 %  11/03/13 1545 117/66 mmHg 98.3 F (36.8 C) Oral 84 18 96 %  11/03/13 1445 114/61 mmHg 98.5 F (36.9 C) Oral - 20 95 %  11/03/13 1345 114/64 mmHg 98.1 F (36.7 C) - 86 20 98 %  11/03/13 1245 118/63 mmHg - - 66 15 96 %  11/03/13 1145 118/69 mmHg - - 63 14 97 %  11/03/13 1115 115/71 mmHg - - 64 12 97 %  11/03/13 1045 120/70 mmHg - - 63 13 98 %  11/03/13 1030 130/77 mmHg - - 66 12 98 %  11/03/13 1015 135/71 mmHg - -  71 12 99 %  11/03/13 1013 - - - 65 9 98 %  11/03/13 0954 142/84 mmHg 98.1 F (36.7 C) - 78 12 96 %  .  RECENT EKG RESULTS:   No orders found for this or any previous visit.  DISCHARGE INSTRUCTIONS:    DISCHARGE MEDICATIONS:     Medication List         diazepam 5 MG tablet  Commonly known as:  VALIUM  Take 0.5-1 tablets (2.5-5 mg total) by mouth every 6 (six) hours as needed for muscle spasms or sedation.     diclofenac 75 MG EC tablet  Commonly known as:  VOLTAREN  Take 75 mg by mouth 2 (two) times daily.     fentaNYL 25 MCG/HR patch  Commonly known as:  DURAGESIC - dosed mcg/hr  Place 25 mcg onto the skin every 3 (three) days.     FLUoxetine 20 MG capsule  Commonly known as:  PROZAC  Take 60 mg by mouth daily.     gabapentin 300 MG capsule  Commonly known as:  NEURONTIN  Take 300 mg by mouth 3 (three) times daily.     HYDROmorphone 2 MG tablet  Commonly known as:  DILAUDID  Take  1-2 tablets (2-4 mg total) by mouth every 4 (four) hours as needed for severe pain (to be used as breakthrough or instead of percocet).     oxyCODONE-acetaminophen 10-325 MG per tablet  Commonly known as:  PERCOCET  Take 1 tablet by mouth every 4 (four) hours as needed for pain.     pyridOXINE 100 MG tablet  Commonly known as:  VITAMIN B-6  Take 200 mg by mouth daily.     tiZANidine 4 MG tablet  Commonly known as:  ZANAFLEX  Take 4 mg by mouth every 6 (six) hours as needed for muscle spasms.     vitamin C 1000 MG tablet  Take 1,000 mg by mouth daily.        FOLLOW UP VISIT:       Follow-up Information   Follow up with SUPPLE,KEVIN M, MD. (call to be seen in 10 -14 days)    Specialty:  Orthopedic Surgery   Contact information:   [address] [place] [address] [place] [address] [phone]       DISCHARGE TO: Home  DISPOSITION: Good  DISCHARGE CONDITION:  Carmen Strickland for Dr. Justice Britain 11/04/2013, 8:00 AM

## 2013-11-04 NOTE — Op Note (Signed)
NAMELIZBETT, GARCIAGARCIA            ACCOUNT NO.:  [id number]  MEDICAL RECORD NO.:  [id number]  LOCATION:  [facility]                         FACILITY:  [facility]  PHYSICIAN:  Metta Clines. Gailyn Crook, M.D.  DATE OF BIRTH:  Mar 31, 1965  DATE OF PROCEDURE:  11/03/2013 DATE OF DISCHARGE:                              OPERATIVE REPORT   PREOP DIAGNOSIS:  End-stage left shoulder osteoarthrosis.  POSTOP DIAGNOSIS:  End-stage left shoulder osteoarthrosis.  PROCEDURE:  Left total shoulder arthroplasty utilizing a press-fit size 10, DePuy global stem with a 44 x 18 standard head, and a cemented pegged 44 glenoid.  SURGEON:  Metta Clines. Damiya Sandefur, M.D.  Terrence DupontOlivia Mackie A. Shuford, P.A.-C.  ANESTHESIA:  General endotracheal as well as an interscalene block.  ESTIMATED BLOOD LOSS:  250 mL.  DRAINS:  None.  HISTORY:  Ms. Charlie is a 48 year old female with history of advanced arthrosis of multiple joints status post right total shoulder arthroplasty that we performed earlier this year.  She has done quite well.  Now, brought to the operating room for planned left total shoulder arthroplasty.  Preoperatively, I had counseled Ms. Lancon regarding treatment options and risks versus benefits thereof.  Possible surgical complications were all reviewed including potential for bleeding, infection, neurovascular injury, persistent pain, loss of motion, anesthetic complication, failure of the implant, and possible need for additional surgery.  She understands and accepts and agrees with our planned procedure.  PROCEDURE IN DETAIL:  After undergoing routine preop evaluation, the patient did receive prophylactic antibiotics.  An interscalene block was established in the holding area by the Anesthesia Department.  Placed supine on the operating table, underwent smooth induction of a general endotracheal anesthesia.  Placed in beach-chair position and appropriately padded and protected.  Left shoulder girdle region  was then sterilely prepped and draped in standard fashion.  Time-out was called.  An anterior approach to the left shoulder was made through a 10 cm deltopectoral incision beginning at the coracoid, extending laterally and distally.  Skin flaps were elevated and electrocautery was used for hemostasis.  Dissection carried deeply with the deltopectoral interval identified, cephalic vein retracted laterally with the deltoid __________ bluntly from proximal to distal in the upper centimeter of pec major was divided to enhance exposure.  Adhesions were then divided beneath the deltoid and Brown retractor was placed.  Self-retaining retractors were placed distally.  The conjoined tendon was then identified, mobilized, and retracted medially.  I identified the anterior humeral circumflex vessels, which were electrocoagulated laterally.  The bicipital groove was then identified.  The biceps tendon was unroofed and divided for later tenodesis.  We then split the rotator interval from the apex to the bicipital groove to the base of the coracoid and then dividing the subscap away from the lesser tuberosity leaving a 1 cm cuff tissue for later repair.  The free margin was then tagged with a series of #2 FiberWire grasping sutures.  We then divided the capsule tissues anterior inferiorly and inferiorly allowing delivery of the humeral head through the wound and carefully protecting the rotator cuff superiorly and posteriorly.  Very prominent osteophytes were noted anteriorly and inferiorly.  We used the extramedullary guide to outline our proposed  humeral head resection and this was then performed with an oscillating saw with care taken to protect the rotator cuff.  Then removed the osteophytes on the anterior inferior and posterior inferior aspects of the humeral head.  I then used hand reaming into the humeral medullary canal up to size 10.  We then performed a cookie cutter broaching of the  proximal humerus and then broached up to size 10.  I should mention that the bone was quite sclerotic and significant effort was required to appropriately seat the size 10 broach.  This was ultimately achieved to our satisfaction.  Of note, this was all performed with mimicking the native retroversion of approximately 30 degrees.  Once this was completed, we placed a metal cap over the cut surface of the proximal humerus.  Then exposed the glenoid using a combination of Fukuda, pitchfork, and snake tongue retractors.  I performed a circumferential capsular release and labral resection.  Also mobilized subscapularis releasing adhesions so it had good elasticity.  The proximal remnant of the biceps was removed with the labrum and gained circumferential exposure of the glenoid, which had significant retroversion.  A guide pin was then placed into the center of the glenoid and we subsequently reamed the glenoid correcting the retroversion obtaining a stable subchondral bony bed and then used a rongeur to remove peripheral bony debris osteophytes from the margins such that a complete exposure was obtained.  A central drill hole was then placed and we then used the guide to place the peripheral peg holes.  Trial reduction showed excellent fit.  At this point, the glenoid was copiously irrigated, meticulously cleaned.  We mixed cement on the back table __________ appropriate consistency.  The cement was then introduced into the peripheral peg holes.  The final glenoid was then impacted, seated appropriately with excellent fixation.  Cement was allowed to harden.  We then returned our attention to the proximal humerus where we inserted our humeral stem mimicking the native retroversion and it seated to appropriate depth with excellent fixation. I then performed trial reductions and ultimately the 44 x 18 standard head showed the best soft tissue balance, good coverage of the proximal humerus and  excellent stability.  The final 44 x 18 standard head was then impacted after meticulously cleaned and dried.  Final reduction was then performed with fit much to our satisfaction.  The subscapularis was then repaired back to the lesser tuberosity through the soft tissue cuff using the #2 FiberWires and also closed the rotator interval laterally with a pair of figure-of-eight #2 FiberWire sutures.  The biceps tendon was then tenodesed at the upper levels of the pec major.  Final irrigation was then completed.  Hemostasis was obtained.  The deltopectoral interval was then reapproximated with a series of #1 Vicryl figure-of-eight sutures.  2-0 Vicryl used for the subcu layer and intracuticular 3-0 Monocryl for the skin followed by Dermabond.  Dry dressing was placed to the left shoulder.  Left arm was placed in a sling.  The patient was awakened, extubated, and taken to recovery room in stable condition.  Jenetta Loges, PA-C was used as an Environmental consultant throughout this case essential for help with positioning of the patient, positioning the extremity, tissue retraction, tissue manipulation, implantation of the prosthesis, wound closure, and intraoperative-decision making.     Metta Clines. Shatika Grinnell, M.D.     KMS/MEDQ  D:  11/03/2013  T:  11/03/2013  Job:  [id number]

## 2013-11-18 ENCOUNTER — Ambulatory Visit: Payer: Medicaid Other | Attending: Orthopedic Surgery | Admitting: Physical Therapy

## 2013-11-18 DIAGNOSIS — Z96619 Presence of unspecified artificial shoulder joint: Secondary | ICD-10-CM | POA: Diagnosis not present

## 2013-11-18 DIAGNOSIS — Z96649 Presence of unspecified artificial hip joint: Secondary | ICD-10-CM | POA: Insufficient documentation

## 2013-11-18 DIAGNOSIS — Z96659 Presence of unspecified artificial knee joint: Secondary | ICD-10-CM | POA: Insufficient documentation

## 2013-11-18 DIAGNOSIS — M25519 Pain in unspecified shoulder: Secondary | ICD-10-CM | POA: Insufficient documentation

## 2013-11-18 DIAGNOSIS — IMO0001 Reserved for inherently not codable concepts without codable children: Secondary | ICD-10-CM | POA: Insufficient documentation

## 2013-11-23 ENCOUNTER — Ambulatory Visit: Payer: Medicaid Other | Attending: Orthopedic Surgery | Admitting: Physical Therapy

## 2013-11-23 DIAGNOSIS — Z96649 Presence of unspecified artificial hip joint: Secondary | ICD-10-CM | POA: Insufficient documentation

## 2013-11-23 DIAGNOSIS — Z96619 Presence of unspecified artificial shoulder joint: Secondary | ICD-10-CM | POA: Insufficient documentation

## 2013-11-23 DIAGNOSIS — Z96659 Presence of unspecified artificial knee joint: Secondary | ICD-10-CM | POA: Diagnosis not present

## 2013-11-23 DIAGNOSIS — M25519 Pain in unspecified shoulder: Secondary | ICD-10-CM | POA: Insufficient documentation

## 2013-11-23 DIAGNOSIS — IMO0001 Reserved for inherently not codable concepts without codable children: Secondary | ICD-10-CM | POA: Diagnosis present

## 2013-11-29 ENCOUNTER — Ambulatory Visit: Payer: Medicaid Other | Admitting: Physical Therapy

## 2013-11-30 ENCOUNTER — Ambulatory Visit: Payer: Medicaid Other | Admitting: Physical Therapy

## 2013-11-30 DIAGNOSIS — IMO0001 Reserved for inherently not codable concepts without codable children: Secondary | ICD-10-CM | POA: Diagnosis not present

## 2013-12-07 ENCOUNTER — Ambulatory Visit: Payer: Medicaid Other | Admitting: Physical Therapy

## 2013-12-07 DIAGNOSIS — IMO0001 Reserved for inherently not codable concepts without codable children: Secondary | ICD-10-CM | POA: Diagnosis not present

## 2013-12-13 ENCOUNTER — Ambulatory Visit: Payer: Medicaid Other | Admitting: Physical Therapy

## 2013-12-13 DIAGNOSIS — IMO0001 Reserved for inherently not codable concepts without codable children: Secondary | ICD-10-CM | POA: Diagnosis not present

## 2013-12-14 ENCOUNTER — Ambulatory Visit: Payer: Medicaid Other | Admitting: Physical Therapy

## 2013-12-21 ENCOUNTER — Ambulatory Visit: Payer: Medicaid Other | Admitting: Physical Therapy

## 2013-12-21 DIAGNOSIS — IMO0001 Reserved for inherently not codable concepts without codable children: Secondary | ICD-10-CM | POA: Diagnosis not present

## 2013-12-29 ENCOUNTER — Ambulatory Visit: Payer: Medicaid Other | Attending: Orthopedic Surgery | Admitting: Physical Therapy

## 2013-12-29 DIAGNOSIS — Z5189 Encounter for other specified aftercare: Secondary | ICD-10-CM | POA: Diagnosis not present

## 2013-12-29 DIAGNOSIS — Z96641 Presence of right artificial hip joint: Secondary | ICD-10-CM | POA: Diagnosis not present

## 2013-12-29 DIAGNOSIS — Z96651 Presence of right artificial knee joint: Secondary | ICD-10-CM | POA: Insufficient documentation

## 2013-12-29 DIAGNOSIS — Z96611 Presence of right artificial shoulder joint: Secondary | ICD-10-CM | POA: Insufficient documentation

## 2013-12-29 DIAGNOSIS — Z96652 Presence of left artificial knee joint: Secondary | ICD-10-CM | POA: Insufficient documentation

## 2013-12-29 DIAGNOSIS — M25512 Pain in left shoulder: Secondary | ICD-10-CM | POA: Diagnosis not present

## 2013-12-29 DIAGNOSIS — Z96642 Presence of left artificial hip joint: Secondary | ICD-10-CM | POA: Insufficient documentation

## 2014-01-05 ENCOUNTER — Ambulatory Visit: Payer: Medicaid Other | Admitting: Physical Therapy

## 2014-01-05 DIAGNOSIS — Z5189 Encounter for other specified aftercare: Secondary | ICD-10-CM | POA: Diagnosis not present

## 2015-08-24 DIAGNOSIS — Z79891 Long term (current) use of opiate analgesic: Secondary | ICD-10-CM | POA: Diagnosis not present

## 2015-08-24 DIAGNOSIS — M542 Cervicalgia: Secondary | ICD-10-CM | POA: Diagnosis not present

## 2015-08-24 DIAGNOSIS — M5136 Other intervertebral disc degeneration, lumbar region: Secondary | ICD-10-CM | POA: Diagnosis not present

## 2015-08-24 DIAGNOSIS — G894 Chronic pain syndrome: Secondary | ICD-10-CM | POA: Diagnosis not present

## 2015-08-28 DIAGNOSIS — M9903 Segmental and somatic dysfunction of lumbar region: Secondary | ICD-10-CM | POA: Diagnosis not present

## 2015-08-28 DIAGNOSIS — M9901 Segmental and somatic dysfunction of cervical region: Secondary | ICD-10-CM | POA: Diagnosis not present

## 2015-08-28 DIAGNOSIS — M4722 Other spondylosis with radiculopathy, cervical region: Secondary | ICD-10-CM | POA: Diagnosis not present

## 2015-08-28 DIAGNOSIS — M4726 Other spondylosis with radiculopathy, lumbar region: Secondary | ICD-10-CM | POA: Diagnosis not present

## 2015-08-29 DIAGNOSIS — M9901 Segmental and somatic dysfunction of cervical region: Secondary | ICD-10-CM | POA: Diagnosis not present

## 2015-08-29 DIAGNOSIS — M9903 Segmental and somatic dysfunction of lumbar region: Secondary | ICD-10-CM | POA: Diagnosis not present

## 2015-08-29 DIAGNOSIS — M4722 Other spondylosis with radiculopathy, cervical region: Secondary | ICD-10-CM | POA: Diagnosis not present

## 2015-08-29 DIAGNOSIS — M4726 Other spondylosis with radiculopathy, lumbar region: Secondary | ICD-10-CM | POA: Diagnosis not present

## 2015-09-13 DIAGNOSIS — M9901 Segmental and somatic dysfunction of cervical region: Secondary | ICD-10-CM | POA: Diagnosis not present

## 2015-09-13 DIAGNOSIS — M4726 Other spondylosis with radiculopathy, lumbar region: Secondary | ICD-10-CM | POA: Diagnosis not present

## 2015-09-13 DIAGNOSIS — M4722 Other spondylosis with radiculopathy, cervical region: Secondary | ICD-10-CM | POA: Diagnosis not present

## 2015-09-13 DIAGNOSIS — M9903 Segmental and somatic dysfunction of lumbar region: Secondary | ICD-10-CM | POA: Diagnosis not present

## 2015-09-17 DIAGNOSIS — M9903 Segmental and somatic dysfunction of lumbar region: Secondary | ICD-10-CM | POA: Diagnosis not present

## 2015-09-17 DIAGNOSIS — M4726 Other spondylosis with radiculopathy, lumbar region: Secondary | ICD-10-CM | POA: Diagnosis not present

## 2015-09-17 DIAGNOSIS — M9901 Segmental and somatic dysfunction of cervical region: Secondary | ICD-10-CM | POA: Diagnosis not present

## 2015-09-17 DIAGNOSIS — M4722 Other spondylosis with radiculopathy, cervical region: Secondary | ICD-10-CM | POA: Diagnosis not present

## 2015-09-19 DIAGNOSIS — M4726 Other spondylosis with radiculopathy, lumbar region: Secondary | ICD-10-CM | POA: Diagnosis not present

## 2015-09-19 DIAGNOSIS — M9901 Segmental and somatic dysfunction of cervical region: Secondary | ICD-10-CM | POA: Diagnosis not present

## 2015-09-19 DIAGNOSIS — M9903 Segmental and somatic dysfunction of lumbar region: Secondary | ICD-10-CM | POA: Diagnosis not present

## 2015-09-19 DIAGNOSIS — M4722 Other spondylosis with radiculopathy, cervical region: Secondary | ICD-10-CM | POA: Diagnosis not present

## 2015-10-01 DIAGNOSIS — M4726 Other spondylosis with radiculopathy, lumbar region: Secondary | ICD-10-CM | POA: Diagnosis not present

## 2015-10-01 DIAGNOSIS — M9901 Segmental and somatic dysfunction of cervical region: Secondary | ICD-10-CM | POA: Diagnosis not present

## 2015-10-01 DIAGNOSIS — M4722 Other spondylosis with radiculopathy, cervical region: Secondary | ICD-10-CM | POA: Diagnosis not present

## 2015-10-01 DIAGNOSIS — M9903 Segmental and somatic dysfunction of lumbar region: Secondary | ICD-10-CM | POA: Diagnosis not present

## 2015-10-03 DIAGNOSIS — M9903 Segmental and somatic dysfunction of lumbar region: Secondary | ICD-10-CM | POA: Diagnosis not present

## 2015-10-03 DIAGNOSIS — M4726 Other spondylosis with radiculopathy, lumbar region: Secondary | ICD-10-CM | POA: Diagnosis not present

## 2015-10-03 DIAGNOSIS — M9901 Segmental and somatic dysfunction of cervical region: Secondary | ICD-10-CM | POA: Diagnosis not present

## 2015-10-03 DIAGNOSIS — M4722 Other spondylosis with radiculopathy, cervical region: Secondary | ICD-10-CM | POA: Diagnosis not present

## 2015-10-08 DIAGNOSIS — M4726 Other spondylosis with radiculopathy, lumbar region: Secondary | ICD-10-CM | POA: Diagnosis not present

## 2015-10-08 DIAGNOSIS — M9901 Segmental and somatic dysfunction of cervical region: Secondary | ICD-10-CM | POA: Diagnosis not present

## 2015-10-08 DIAGNOSIS — M4722 Other spondylosis with radiculopathy, cervical region: Secondary | ICD-10-CM | POA: Diagnosis not present

## 2015-10-08 DIAGNOSIS — M9903 Segmental and somatic dysfunction of lumbar region: Secondary | ICD-10-CM | POA: Diagnosis not present

## 2015-10-09 DIAGNOSIS — H5213 Myopia, bilateral: Secondary | ICD-10-CM | POA: Diagnosis not present

## 2015-10-09 DIAGNOSIS — H52223 Regular astigmatism, bilateral: Secondary | ICD-10-CM | POA: Diagnosis not present

## 2015-10-09 DIAGNOSIS — H524 Presbyopia: Secondary | ICD-10-CM | POA: Diagnosis not present

## 2015-10-09 DIAGNOSIS — H04123 Dry eye syndrome of bilateral lacrimal glands: Secondary | ICD-10-CM | POA: Diagnosis not present

## 2015-10-16 DIAGNOSIS — M9901 Segmental and somatic dysfunction of cervical region: Secondary | ICD-10-CM | POA: Diagnosis not present

## 2015-10-16 DIAGNOSIS — M9903 Segmental and somatic dysfunction of lumbar region: Secondary | ICD-10-CM | POA: Diagnosis not present

## 2015-10-16 DIAGNOSIS — M4726 Other spondylosis with radiculopathy, lumbar region: Secondary | ICD-10-CM | POA: Diagnosis not present

## 2015-10-16 DIAGNOSIS — M4722 Other spondylosis with radiculopathy, cervical region: Secondary | ICD-10-CM | POA: Diagnosis not present

## 2015-10-23 DIAGNOSIS — M4726 Other spondylosis with radiculopathy, lumbar region: Secondary | ICD-10-CM | POA: Diagnosis not present

## 2015-10-23 DIAGNOSIS — M4722 Other spondylosis with radiculopathy, cervical region: Secondary | ICD-10-CM | POA: Diagnosis not present

## 2015-10-23 DIAGNOSIS — M9903 Segmental and somatic dysfunction of lumbar region: Secondary | ICD-10-CM | POA: Diagnosis not present

## 2015-10-23 DIAGNOSIS — M9901 Segmental and somatic dysfunction of cervical region: Secondary | ICD-10-CM | POA: Diagnosis not present

## 2015-10-30 DIAGNOSIS — M4726 Other spondylosis with radiculopathy, lumbar region: Secondary | ICD-10-CM | POA: Diagnosis not present

## 2015-10-30 DIAGNOSIS — M9901 Segmental and somatic dysfunction of cervical region: Secondary | ICD-10-CM | POA: Diagnosis not present

## 2015-10-30 DIAGNOSIS — M4722 Other spondylosis with radiculopathy, cervical region: Secondary | ICD-10-CM | POA: Diagnosis not present

## 2015-10-30 DIAGNOSIS — M9903 Segmental and somatic dysfunction of lumbar region: Secondary | ICD-10-CM | POA: Diagnosis not present

## 2015-10-31 ENCOUNTER — Other Ambulatory Visit: Payer: Self-pay | Admitting: Family Medicine

## 2015-10-31 ENCOUNTER — Other Ambulatory Visit (HOSPITAL_COMMUNITY)
Admission: RE | Admit: 2015-10-31 | Discharge: 2015-10-31 | Disposition: A | Discharge status: Home / Self Care | Payer: PPO | Source: Ambulatory Visit | Attending: Family Medicine | Admitting: Family Medicine

## 2015-10-31 DIAGNOSIS — Z124 Encounter for screening for malignant neoplasm of cervix: Secondary | ICD-10-CM | POA: Insufficient documentation

## 2015-10-31 DIAGNOSIS — Z131 Encounter for screening for diabetes mellitus: Secondary | ICD-10-CM | POA: Diagnosis not present

## 2015-10-31 DIAGNOSIS — M545 Low back pain: Secondary | ICD-10-CM | POA: Diagnosis not present

## 2015-10-31 DIAGNOSIS — F3281 Premenstrual dysphoric disorder: Secondary | ICD-10-CM | POA: Diagnosis not present

## 2015-10-31 DIAGNOSIS — L821 Other seborrheic keratosis: Secondary | ICD-10-CM | POA: Diagnosis not present

## 2015-10-31 DIAGNOSIS — L918 Other hypertrophic disorders of the skin: Secondary | ICD-10-CM | POA: Diagnosis not present

## 2015-10-31 DIAGNOSIS — Z0000 Encounter for general adult medical examination without abnormal findings: Secondary | ICD-10-CM | POA: Diagnosis not present

## 2015-11-02 LAB — CYTOLOGY - PAP

## 2015-11-05 DIAGNOSIS — M4722 Other spondylosis with radiculopathy, cervical region: Secondary | ICD-10-CM | POA: Diagnosis not present

## 2015-11-05 DIAGNOSIS — M9901 Segmental and somatic dysfunction of cervical region: Secondary | ICD-10-CM | POA: Diagnosis not present

## 2015-11-05 DIAGNOSIS — M9903 Segmental and somatic dysfunction of lumbar region: Secondary | ICD-10-CM | POA: Diagnosis not present

## 2015-11-05 DIAGNOSIS — M4726 Other spondylosis with radiculopathy, lumbar region: Secondary | ICD-10-CM | POA: Diagnosis not present

## 2015-11-08 ENCOUNTER — Other Ambulatory Visit: Payer: Self-pay | Admitting: Family Medicine

## 2015-11-08 DIAGNOSIS — N6489 Other specified disorders of breast: Secondary | ICD-10-CM

## 2015-11-09 DIAGNOSIS — Z1211 Encounter for screening for malignant neoplasm of colon: Secondary | ICD-10-CM | POA: Diagnosis not present

## 2015-11-09 DIAGNOSIS — K5903 Drug induced constipation: Secondary | ICD-10-CM | POA: Diagnosis not present

## 2015-11-13 DIAGNOSIS — M4722 Other spondylosis with radiculopathy, cervical region: Secondary | ICD-10-CM | POA: Diagnosis not present

## 2015-11-13 DIAGNOSIS — M9903 Segmental and somatic dysfunction of lumbar region: Secondary | ICD-10-CM | POA: Diagnosis not present

## 2015-11-13 DIAGNOSIS — M4726 Other spondylosis with radiculopathy, lumbar region: Secondary | ICD-10-CM | POA: Diagnosis not present

## 2015-11-13 DIAGNOSIS — M9901 Segmental and somatic dysfunction of cervical region: Secondary | ICD-10-CM | POA: Diagnosis not present

## 2015-11-14 ENCOUNTER — Ambulatory Visit
Admission: RE | Admit: 2015-11-14 | Discharge: 2015-11-14 | Disposition: A | Discharge status: Home / Self Care | Payer: PPO | Source: Ambulatory Visit | Attending: Family Medicine | Admitting: Family Medicine

## 2015-11-14 DIAGNOSIS — N63 Unspecified lump in breast: Secondary | ICD-10-CM | POA: Diagnosis not present

## 2015-11-14 DIAGNOSIS — N6489 Other specified disorders of breast: Secondary | ICD-10-CM

## 2015-11-20 ENCOUNTER — Other Ambulatory Visit: Payer: Self-pay | Admitting: Gastroenterology

## 2015-11-20 DIAGNOSIS — M4726 Other spondylosis with radiculopathy, lumbar region: Secondary | ICD-10-CM | POA: Diagnosis not present

## 2015-11-20 DIAGNOSIS — M9903 Segmental and somatic dysfunction of lumbar region: Secondary | ICD-10-CM | POA: Diagnosis not present

## 2015-11-20 DIAGNOSIS — M9901 Segmental and somatic dysfunction of cervical region: Secondary | ICD-10-CM | POA: Diagnosis not present

## 2015-11-20 DIAGNOSIS — M4722 Other spondylosis with radiculopathy, cervical region: Secondary | ICD-10-CM | POA: Diagnosis not present

## 2015-12-28 ENCOUNTER — Encounter (HOSPITAL_COMMUNITY): Payer: Self-pay | Admitting: *Deleted

## 2015-12-28 NOTE — Progress Notes (Signed)
Pt denies SOB, chest pain, and being under the care of a cardiologist. Pt denies having an echo, stress test and cardiac cat. Pt denies having a chest x ray and EKG within the last year. Pt denies having any recent labs. Pt made aware to stop  taking Aspirin, vitamins, fish oil and herbal medications. Do not take any NSAIDs ie: Ibuprofen, Advil, Naproxen, BC and Goody Powder or any medication containing Aspirin such as Voltaren. Pt verbalized understanding of all pre-op instructions.

## 2015-12-31 ENCOUNTER — Other Ambulatory Visit: Payer: Self-pay | Admitting: Gastroenterology

## 2015-12-31 ENCOUNTER — Ambulatory Visit (HOSPITAL_COMMUNITY): Payer: PPO | Admitting: Anesthesiology

## 2015-12-31 ENCOUNTER — Encounter (HOSPITAL_COMMUNITY): Payer: Self-pay | Admitting: Anesthesiology

## 2015-12-31 ENCOUNTER — Ambulatory Visit (HOSPITAL_COMMUNITY)
Admission: RE | Admit: 2015-12-31 | Discharge: 2015-12-31 | Disposition: A | Discharge status: Home / Self Care | Payer: PPO | Source: Ambulatory Visit | Attending: Gastroenterology | Admitting: Gastroenterology

## 2015-12-31 ENCOUNTER — Encounter (HOSPITAL_COMMUNITY)
Admission: RE | Disposition: A | Discharge status: Home / Self Care | Payer: Self-pay | Source: Ambulatory Visit | Attending: Gastroenterology

## 2015-12-31 DIAGNOSIS — M1990 Unspecified osteoarthritis, unspecified site: Secondary | ICD-10-CM | POA: Diagnosis not present

## 2015-12-31 DIAGNOSIS — K644 Residual hemorrhoidal skin tags: Secondary | ICD-10-CM | POA: Diagnosis not present

## 2015-12-31 DIAGNOSIS — Z1211 Encounter for screening for malignant neoplasm of colon: Secondary | ICD-10-CM | POA: Insufficient documentation

## 2015-12-31 DIAGNOSIS — F329 Major depressive disorder, single episode, unspecified: Secondary | ICD-10-CM | POA: Insufficient documentation

## 2015-12-31 DIAGNOSIS — F419 Anxiety disorder, unspecified: Secondary | ICD-10-CM | POA: Diagnosis not present

## 2015-12-31 HISTORY — PX: COLONOSCOPY WITH PROPOFOL: SHX5780

## 2015-12-31 SURGERY — COLONOSCOPY WITH PROPOFOL
Anesthesia: Monitor Anesthesia Care

## 2015-12-31 MED ORDER — LACTATED RINGERS IV SOLN
INTRAVENOUS | Status: DC
  Administered 2015-12-31 (×2): via INTRAVENOUS

## 2015-12-31 MED ORDER — LIDOCAINE HCL (CARDIAC) 20 MG/ML IV SOLN
INTRAVENOUS | Status: DC | PRN
  Administered 2015-12-31: 10 mg via INTRAVENOUS

## 2015-12-31 MED ORDER — SODIUM CHLORIDE 0.9 % IV SOLN: INTRAVENOUS | Status: DC

## 2015-12-31 MED ORDER — PROPOFOL 10 MG/ML IV BOLUS
INTRAVENOUS | Status: DC | PRN
Start: 2015-12-31 — End: 2015-12-31
  Administered 2015-12-31: 10 mg via INTRAVENOUS
  Administered 2015-12-31 (×2): 20 mg via INTRAVENOUS

## 2015-12-31 MED ORDER — PROPOFOL 500 MG/50ML IV EMUL
INTRAVENOUS | Status: DC | PRN
  Administered 2015-12-31: 50 ug/kg/min via INTRAVENOUS

## 2015-12-31 NOTE — Anesthesia Preprocedure Evaluation (Signed)
Anesthesia Evaluation  Patient identified by MRN, date of birth, ID band Patient awake    Reviewed: Allergy & Precautions, H&P , NPO status , Patient's Chart, lab work & pertinent test results  History of Anesthesia Complications (+) PONV and history of anesthetic complications  Airway Mallampati: II  TM Distance: >3 FB Neck ROM: Full    Dental  (+) Teeth Intact   Pulmonary neg pulmonary ROS,    breath sounds clear to auscultation       Cardiovascular  Rhythm:Regular     Neuro/Psych neg Seizures PSYCHIATRIC DISORDERS Anxiety Depression Chronic back pain, narcotic dependent  Neuromuscular disease    GI/Hepatic negative GI ROS, Neg liver ROS,   Endo/Other  negative endocrine ROS  Renal/GU negative Renal ROS     Musculoskeletal  (+) Arthritis , Osteoarthritis,    Abdominal   Peds  Hematology negative hematology ROS (+)   Anesthesia Other Findings   Reproductive/Obstetrics                             Anesthesia Physical  Anesthesia Plan  ASA: II  Anesthesia Plan: MAC   Post-op Pain Management:    Induction: Intravenous  Airway Management Planned:   Additional Equipment:   Intra-op Plan:   Post-operative Plan:   Informed Consent: I have reviewed the patients History and Physical, chart, labs and discussed the procedure including the risks, benefits and alternatives for the proposed anesthesia with the patient or authorized representative who has indicated his/her understanding and acceptance.   Dental advisory given  Plan Discussed with: CRNA  Anesthesia Plan Comments:         Anesthesia Quick Evaluation

## 2015-12-31 NOTE — Discharge Instructions (Signed)
Colonoscopy ° °Post procedure instructions: ° °Read the instructions outlined below and refer to this sheet in the next few weeks. These discharge instructions provide you with general information on caring for yourself after you leave the hospital. Your doctor may also give you specific instructions. While your treatment has been planned according to the most current medical practices available, unavoidable complications occasionally occur. If you have any problems or questions after discharge, call Dr. Larenda Reedy at [facility] ([phone]). ° °HOME CARE INSTRUCTIONS ° °ACTIVITY: °· You may resume your regular activity, but move at a slower pace for the next 24 hours.  °· Take frequent rest periods for the next 24 hours.  °· Walking will help get rid of the air and reduce the bloated feeling in your belly (abdomen).  °· No driving for 24 hours (because of the medicine (anesthesia) used during the test).  °· You may shower.  °· Do not sign any important legal documents or operate any machinery for 24 hours (because of the anesthesia used during the test).  °NUTRITION: °· Drink plenty of fluids.  °· You may resume your normal diet as instructed by your doctor.  °· Begin with a light meal and progress to your normal diet. Heavy or fried foods are harder to digest and may make you feel sick to your stomach (nauseated).  °· Avoid alcoholic beverages for 24 hours or as instructed.  °MEDICATIONS: °· You may resume your normal medications unless your doctor tells you otherwise.  °WHAT TO EXPECT TODAY: °· Some feelings of bloating in the abdomen.  °· Passage of more gas than usual.  °· Spotting of blood in your stool or on the toilet paper.  °IF YOU HAD POLYPS REMOVED DURING THE COLONOSCOPY: °· No aspirin products for 7 days or as instructed.  °· No alcohol for 7 days or as instructed.  °· Eat a soft diet for the next 24 hours.  ° °FINDING OUT THE RESULTS OF YOUR TEST ° °Not all test results are available during your  visit. If your test results are not back during the visit, make an appointment with your caregiver to find out the results. Do not assume everything is normal if you have not heard from your caregiver or the medical facility. It is important for you to follow up on all of your test results.  ° ° ° °SEEK IMMEDIATE MEDICAL CARE IF: ° °· You have more than a spotting of blood in your stool.  °· Your belly is swollen (abdominal distention).  °· You are nauseated or vomiting.  °· You have a fever.  °· You have abdominal pain or discomfort that is severe or gets worse throughout the day.  ° ° °Document Released: 10/23/2003 Document Revised: 11/20/2010 Document Reviewed: 10/21/2007 °ExitCare® Patient Information ©2012 [employer]. ° °

## 2015-12-31 NOTE — Transfer of Care (Signed)
Immediate Anesthesia Transfer of Care Note  Patient: Carmen Strickland  Procedure(s) Performed: Procedure(s): COLONOSCOPY WITH PROPOFOL (N/A)  Patient Location: Endoscopy Unit  Anesthesia Type:MAC  Level of Consciousness: awake, oriented and patient cooperative  Airway & Oxygen Therapy: Patient Spontanous Breathing and Patient connected to face mask oxygen  Post-op Assessment: Report given to RN and Post -op Vital signs reviewed and stable  Post vital signs: Reviewed  Last Vitals:  Vitals:   12/31/15 0903  BP: (!) 144/85  Pulse: 62  Resp: 13  Temp: 37.1 C    Last Pain:  Vitals:   12/31/15 0903  TempSrc: Oral  PainSc: 6          Complications: No apparent anesthesia complications

## 2015-12-31 NOTE — H&P (Signed)
Patient interval history reviewed.  Patient examined again.  There has been no change from documented H/P dated 12/24/15 (scanned into chart from our office) except as documented above

## 2015-12-31 NOTE — Op Note (Signed)
[facility] Patient Name: Carmen Strickland Procedure Date : 12/31/2015 MRN: [id number] Attending MD: Arta Silence , MD Date of Birth: 1965-07-10 CSN: [id number] Age: 50 Admit Type: Outpatient Procedure:                Colonoscopy Indications:              Screening for colorectal malignant neoplasm, This                            is the patient's first colonoscopy Providers:                Arta Silence, MD, Kingsley Plan, RN, Corliss Parish, Technician Referring MD:             Kelton Pillar, MD Medicines:                Propofol per Anesthesia Complications:            No immediate complications. Estimated Blood Loss:     Estimated blood loss: none. Procedure:                Pre-Anesthesia Assessment:                           - Prior to the procedure, a History and Physical                            was performed, and patient medications and                            allergies were reviewed. The patient's tolerance of                            previous anesthesia was also reviewed. The risks                            and benefits of the procedure and the sedation                            options and risks were discussed with the patient.                            All questions were answered, and informed consent                            was obtained. Prior Anticoagulants: The patient has                            taken previous NSAID medication. ASA Grade                            Assessment: II - A patient with mild systemic  disease. After reviewing the risks and benefits,                            the patient was deemed in satisfactory condition to                            undergo the procedure.                           After obtaining informed consent, the colonoscope                            was passed under direct vision. Throughout the                            procedure, the  patient's blood pressure, pulse, and                            oxygen saturations were monitored continuously. The                            [id number] scope was introduced through                            the anus and advanced to the the cecum, identified                            by appendiceal orifice and ileocecal valve. The                            ileocecal valve, appendiceal orifice, and rectum                            were photographed. The entire colon was examined.                            The colonoscopy was performed without difficulty.                            The patient tolerated the procedure well. The                            quality of the bowel preparation was good. Scope In: 10:27:07 AM Scope Out: 10:43:38 AM Scope Withdrawal Time: 0 hours 7 minutes 6 seconds  Total Procedure Duration: 0 hours 16 minutes 31 seconds  Findings:      The perianal exam findings include non-thrombosed external hemorrhoids.      Colon otherwise normal; no other polyps, masses, vascular ectasias, or       inflammatory changes were seen.      The retroflexed view of the distal rectum and anal verge was normal and       showed no anal or rectal abnormalities. Impression:               - Non-thrombosed external hemorrhoids found on  perianal exam.                           - The distal rectum and anal verge are normal on                            retroflexion view.                           - The examination was otherwise normal. Moderate Sedation:      None Recommendation:           - Patient has a contact number available for                            emergencies. The signs and symptoms of potential                            delayed complications were discussed with the                            patient. Return to normal activities tomorrow.                            Written discharge instructions were provided to the                             patient.                           - Discharge patient to home (via wheelchair).                           - Resume previous diet today.                           - Continue present medications.                           - Repeat colonoscopy in 10 years for screening                            purposes.                           - Return to GI clinic PRN.                           - Return to referring physician as previously                            scheduled. Procedure Code(s):        --- Professional ---                           [phone], Colonoscopy, flexible; diagnostic, including  collection of specimen(s) by brushing or washing,                            when performed (separate procedure) Diagnosis Code(s):        --- Professional ---                           Z12.11, Encounter for screening for malignant                            neoplasm of colon                           K64.4, Residual hemorrhoidal skin tags CPT copyright 2016 American Medical Association. All rights reserved. The codes documented in this report are preliminary and upon coder review may  be revised to meet current compliance requirements. Arta Silence, MD 12/31/2015 10:56:17 AM This report has been signed electronically. Number of Addenda: 0

## 2015-12-31 NOTE — Anesthesia Postprocedure Evaluation (Signed)
Anesthesia Post Note  Patient: Carmen Strickland  Procedure(s) Performed: Procedure(s) (LRB): COLONOSCOPY WITH PROPOFOL (N/A)  Patient location during evaluation: PACU Anesthesia Type: MAC Level of consciousness: awake and alert Pain management: pain level controlled Vital Signs Assessment: post-procedure vital signs reviewed and stable Respiratory status: spontaneous breathing Cardiovascular status: stable Anesthetic complications: no    Last Vitals:  Vitals:   12/31/15 1103 12/31/15 1113  BP: 126/76 135/76  Pulse: (!) 58 (!) 58  Resp: 17 13  Temp:      Last Pain:  Vitals:   12/31/15 1054  TempSrc: Oral  PainSc:                  Nolon Nations

## 2015-12-31 NOTE — Anesthesia Procedure Notes (Signed)
Procedure Name: MAC Date/Time: 12/31/2015 10:17 AM Performed by: Jenne Campus Pre-anesthesia Checklist: Patient identified, Emergency Drugs available, Suction available, Patient being monitored and Timeout performed Patient Re-evaluated:Patient Re-evaluated prior to inductionOxygen Delivery Method: Simple face mask

## 2015-12-31 NOTE — Interval H&P Note (Signed)
History and Physical Interval Note:  12/31/2015 10:07 AM  Carmen Strickland  has presented today for surgery, with the diagnosis of screening  The various methods of treatment have been discussed with the patient and family. After consideration of risks, benefits and other options for treatment, the patient has consented to  Procedure(s): COLONOSCOPY WITH PROPOFOL (N/A) as a surgical intervention .  The patient's history has been reviewed, patient examined, no change in status, stable for surgery.  I have reviewed the patient's chart and labs.  Questions were answered to the patient's satisfaction.     Landry Dyke

## 2016-01-01 ENCOUNTER — Encounter (HOSPITAL_COMMUNITY): Payer: Self-pay | Admitting: Gastroenterology

## 2016-06-02 DIAGNOSIS — G894 Chronic pain syndrome: Secondary | ICD-10-CM | POA: Diagnosis not present

## 2016-08-07 DIAGNOSIS — M25552 Pain in left hip: Secondary | ICD-10-CM | POA: Diagnosis not present

## 2016-08-07 DIAGNOSIS — Z96643 Presence of artificial hip joint, bilateral: Secondary | ICD-10-CM | POA: Diagnosis not present

## 2016-08-07 DIAGNOSIS — Z471 Aftercare following joint replacement surgery: Secondary | ICD-10-CM | POA: Diagnosis not present

## 2016-08-26 ENCOUNTER — Other Ambulatory Visit (HOSPITAL_COMMUNITY): Payer: Self-pay | Admitting: Orthopedic Surgery

## 2016-08-26 DIAGNOSIS — Z96643 Presence of artificial hip joint, bilateral: Secondary | ICD-10-CM

## 2016-08-28 ENCOUNTER — Encounter (HOSPITAL_COMMUNITY): Payer: PPO

## 2016-08-28 ENCOUNTER — Encounter (HOSPITAL_COMMUNITY)
Admission: RE | Admit: 2016-08-28 | Discharge: 2016-08-28 | Disposition: A | Discharge status: Home / Self Care | Payer: PPO | Source: Ambulatory Visit | Attending: Orthopedic Surgery | Admitting: Orthopedic Surgery

## 2016-08-28 DIAGNOSIS — Z96643 Presence of artificial hip joint, bilateral: Secondary | ICD-10-CM | POA: Insufficient documentation

## 2016-08-28 DIAGNOSIS — Q6589 Other specified congenital deformities of hip: Secondary | ICD-10-CM | POA: Diagnosis not present

## 2016-08-28 MED ORDER — TECHNETIUM TC 99M MEDRONATE IV KIT
25.0000 | PACK | Freq: Once | INTRAVENOUS | Status: AC | PRN
  Administered 2016-08-28: 25 via INTRAVENOUS

## 2016-09-07 DIAGNOSIS — L237 Allergic contact dermatitis due to plants, except food: Secondary | ICD-10-CM | POA: Diagnosis not present

## 2016-09-30 DIAGNOSIS — G894 Chronic pain syndrome: Secondary | ICD-10-CM | POA: Diagnosis not present

## 2016-09-30 DIAGNOSIS — M5136 Other intervertebral disc degeneration, lumbar region: Secondary | ICD-10-CM | POA: Diagnosis not present

## 2016-11-06 ENCOUNTER — Other Ambulatory Visit: Payer: Self-pay | Admitting: Family Medicine

## 2016-11-06 DIAGNOSIS — Z1231 Encounter for screening mammogram for malignant neoplasm of breast: Secondary | ICD-10-CM

## 2016-12-04 ENCOUNTER — Ambulatory Visit: Payer: PPO

## 2016-12-04 DIAGNOSIS — Z131 Encounter for screening for diabetes mellitus: Secondary | ICD-10-CM | POA: Diagnosis not present

## 2016-12-04 DIAGNOSIS — Z0000 Encounter for general adult medical examination without abnormal findings: Secondary | ICD-10-CM | POA: Diagnosis not present

## 2016-12-04 DIAGNOSIS — F3281 Premenstrual dysphoric disorder: Secondary | ICD-10-CM | POA: Diagnosis not present

## 2016-12-04 DIAGNOSIS — Z23 Encounter for immunization: Secondary | ICD-10-CM | POA: Diagnosis not present

## 2016-12-04 DIAGNOSIS — L219 Seborrheic dermatitis, unspecified: Secondary | ICD-10-CM | POA: Diagnosis not present

## 2016-12-04 DIAGNOSIS — M545 Low back pain: Secondary | ICD-10-CM | POA: Diagnosis not present

## 2016-12-04 DIAGNOSIS — R5383 Other fatigue: Secondary | ICD-10-CM | POA: Diagnosis not present

## 2016-12-04 DIAGNOSIS — E7800 Pure hypercholesterolemia, unspecified: Secondary | ICD-10-CM | POA: Diagnosis not present

## 2016-12-19 ENCOUNTER — Ambulatory Visit
Admission: RE | Admit: 2016-12-19 | Discharge: 2016-12-19 | Disposition: A | Discharge status: Home / Self Care | Payer: PPO | Source: Ambulatory Visit | Attending: Family Medicine | Admitting: Family Medicine

## 2016-12-19 DIAGNOSIS — Z1231 Encounter for screening mammogram for malignant neoplasm of breast: Secondary | ICD-10-CM | POA: Diagnosis not present

## 2017-01-30 DIAGNOSIS — M5136 Other intervertebral disc degeneration, lumbar region: Secondary | ICD-10-CM | POA: Diagnosis not present

## 2017-02-19 DIAGNOSIS — M47817 Spondylosis without myelopathy or radiculopathy, lumbosacral region: Secondary | ICD-10-CM | POA: Diagnosis not present

## 2017-04-17 DIAGNOSIS — M5136 Other intervertebral disc degeneration, lumbar region: Secondary | ICD-10-CM | POA: Diagnosis not present

## 2017-04-17 DIAGNOSIS — M47816 Spondylosis without myelopathy or radiculopathy, lumbar region: Secondary | ICD-10-CM | POA: Diagnosis not present

## 2017-05-01 DIAGNOSIS — G894 Chronic pain syndrome: Secondary | ICD-10-CM | POA: Diagnosis not present

## 2017-05-01 DIAGNOSIS — M47816 Spondylosis without myelopathy or radiculopathy, lumbar region: Secondary | ICD-10-CM | POA: Diagnosis not present

## 2017-05-09 DIAGNOSIS — M47816 Spondylosis without myelopathy or radiculopathy, lumbar region: Secondary | ICD-10-CM | POA: Diagnosis not present

## 2017-06-23 DIAGNOSIS — K649 Unspecified hemorrhoids: Secondary | ICD-10-CM | POA: Diagnosis not present

## 2017-06-23 DIAGNOSIS — L259 Unspecified contact dermatitis, unspecified cause: Secondary | ICD-10-CM | POA: Diagnosis not present

## 2017-07-09 DIAGNOSIS — H52223 Regular astigmatism, bilateral: Secondary | ICD-10-CM | POA: Diagnosis not present

## 2017-07-09 DIAGNOSIS — H5213 Myopia, bilateral: Secondary | ICD-10-CM | POA: Diagnosis not present

## 2017-07-09 DIAGNOSIS — H524 Presbyopia: Secondary | ICD-10-CM | POA: Diagnosis not present

## 2017-07-09 DIAGNOSIS — H04123 Dry eye syndrome of bilateral lacrimal glands: Secondary | ICD-10-CM | POA: Diagnosis not present

## 2017-08-03 DIAGNOSIS — M47816 Spondylosis without myelopathy or radiculopathy, lumbar region: Secondary | ICD-10-CM | POA: Diagnosis not present

## 2017-08-03 DIAGNOSIS — Z79891 Long term (current) use of opiate analgesic: Secondary | ICD-10-CM | POA: Diagnosis not present

## 2017-08-18 DIAGNOSIS — M47816 Spondylosis without myelopathy or radiculopathy, lumbar region: Secondary | ICD-10-CM | POA: Diagnosis not present

## 2017-09-08 DIAGNOSIS — M47816 Spondylosis without myelopathy or radiculopathy, lumbar region: Secondary | ICD-10-CM | POA: Diagnosis not present

## 2017-09-08 DIAGNOSIS — M545 Low back pain: Secondary | ICD-10-CM | POA: Diagnosis not present

## 2017-09-08 DIAGNOSIS — G894 Chronic pain syndrome: Secondary | ICD-10-CM | POA: Diagnosis not present

## 2017-11-18 DIAGNOSIS — M542 Cervicalgia: Secondary | ICD-10-CM | POA: Diagnosis not present

## 2017-11-25 DIAGNOSIS — M1990 Unspecified osteoarthritis, unspecified site: Secondary | ICD-10-CM | POA: Diagnosis not present

## 2017-11-25 DIAGNOSIS — M064 Inflammatory polyarthropathy: Secondary | ICD-10-CM | POA: Diagnosis not present

## 2017-11-25 DIAGNOSIS — M419 Scoliosis, unspecified: Secondary | ICD-10-CM | POA: Diagnosis not present

## 2017-11-25 DIAGNOSIS — M19041 Primary osteoarthritis, right hand: Secondary | ICD-10-CM | POA: Diagnosis not present

## 2017-11-25 DIAGNOSIS — G894 Chronic pain syndrome: Secondary | ICD-10-CM | POA: Diagnosis not present

## 2017-11-25 DIAGNOSIS — M542 Cervicalgia: Secondary | ICD-10-CM | POA: Diagnosis not present

## 2017-11-25 DIAGNOSIS — M549 Dorsalgia, unspecified: Secondary | ICD-10-CM | POA: Diagnosis not present

## 2017-11-25 DIAGNOSIS — M19042 Primary osteoarthritis, left hand: Secondary | ICD-10-CM | POA: Diagnosis not present

## 2017-11-25 DIAGNOSIS — M79642 Pain in left hand: Secondary | ICD-10-CM | POA: Diagnosis not present

## 2017-11-25 DIAGNOSIS — M79641 Pain in right hand: Secondary | ICD-10-CM | POA: Diagnosis not present

## 2017-11-25 DIAGNOSIS — Z79891 Long term (current) use of opiate analgesic: Secondary | ICD-10-CM | POA: Diagnosis not present

## 2017-11-25 DIAGNOSIS — M5030 Other cervical disc degeneration, unspecified cervical region: Secondary | ICD-10-CM | POA: Diagnosis not present

## 2018-01-13 DIAGNOSIS — M545 Low back pain: Secondary | ICD-10-CM | POA: Diagnosis not present

## 2018-01-13 DIAGNOSIS — Z791 Long term (current) use of non-steroidal anti-inflammatories (NSAID): Secondary | ICD-10-CM | POA: Diagnosis not present

## 2018-01-13 DIAGNOSIS — Z0000 Encounter for general adult medical examination without abnormal findings: Secondary | ICD-10-CM | POA: Diagnosis not present

## 2018-01-13 DIAGNOSIS — E7800 Pure hypercholesterolemia, unspecified: Secondary | ICD-10-CM | POA: Diagnosis not present

## 2018-01-13 DIAGNOSIS — Z741 Need for assistance with personal care: Secondary | ICD-10-CM | POA: Diagnosis not present

## 2018-01-13 DIAGNOSIS — Z23 Encounter for immunization: Secondary | ICD-10-CM | POA: Diagnosis not present

## 2018-01-13 DIAGNOSIS — F3281 Premenstrual dysphoric disorder: Secondary | ICD-10-CM | POA: Diagnosis not present

## 2018-03-29 DIAGNOSIS — Z79891 Long term (current) use of opiate analgesic: Secondary | ICD-10-CM | POA: Diagnosis not present

## 2018-03-29 DIAGNOSIS — M4692 Unspecified inflammatory spondylopathy, cervical region: Secondary | ICD-10-CM | POA: Diagnosis not present

## 2018-03-29 DIAGNOSIS — G894 Chronic pain syndrome: Secondary | ICD-10-CM | POA: Diagnosis not present

## 2018-04-15 DIAGNOSIS — M47812 Spondylosis without myelopathy or radiculopathy, cervical region: Secondary | ICD-10-CM | POA: Diagnosis not present

## 2018-06-03 DIAGNOSIS — M47816 Spondylosis without myelopathy or radiculopathy, lumbar region: Secondary | ICD-10-CM | POA: Diagnosis not present

## 2018-06-14 DIAGNOSIS — F321 Major depressive disorder, single episode, moderate: Secondary | ICD-10-CM | POA: Diagnosis not present

## 2018-08-13 DIAGNOSIS — Z23 Encounter for immunization: Secondary | ICD-10-CM | POA: Diagnosis not present

## 2018-08-31 DIAGNOSIS — M47812 Spondylosis without myelopathy or radiculopathy, cervical region: Secondary | ICD-10-CM | POA: Diagnosis not present

## 2018-08-31 DIAGNOSIS — Z79891 Long term (current) use of opiate analgesic: Secondary | ICD-10-CM | POA: Diagnosis not present

## 2018-08-31 DIAGNOSIS — Z79899 Other long term (current) drug therapy: Secondary | ICD-10-CM | POA: Diagnosis not present

## 2018-08-31 DIAGNOSIS — M545 Low back pain: Secondary | ICD-10-CM | POA: Diagnosis not present

## 2018-08-31 DIAGNOSIS — G894 Chronic pain syndrome: Secondary | ICD-10-CM | POA: Diagnosis not present

## 2018-09-06 DIAGNOSIS — M9903 Segmental and somatic dysfunction of lumbar region: Secondary | ICD-10-CM | POA: Diagnosis not present

## 2018-09-06 DIAGNOSIS — S335XXA Sprain of ligaments of lumbar spine, initial encounter: Secondary | ICD-10-CM | POA: Diagnosis not present

## 2018-09-07 DIAGNOSIS — M47816 Spondylosis without myelopathy or radiculopathy, lumbar region: Secondary | ICD-10-CM | POA: Diagnosis not present

## 2018-09-08 DIAGNOSIS — M9903 Segmental and somatic dysfunction of lumbar region: Secondary | ICD-10-CM | POA: Diagnosis not present

## 2018-09-08 DIAGNOSIS — S335XXA Sprain of ligaments of lumbar spine, initial encounter: Secondary | ICD-10-CM | POA: Diagnosis not present

## 2018-09-13 DIAGNOSIS — M9903 Segmental and somatic dysfunction of lumbar region: Secondary | ICD-10-CM | POA: Diagnosis not present

## 2018-09-13 DIAGNOSIS — S335XXA Sprain of ligaments of lumbar spine, initial encounter: Secondary | ICD-10-CM | POA: Diagnosis not present

## 2018-09-14 DIAGNOSIS — Z79899 Other long term (current) drug therapy: Secondary | ICD-10-CM | POA: Diagnosis not present

## 2018-09-14 DIAGNOSIS — Z5181 Encounter for therapeutic drug level monitoring: Secondary | ICD-10-CM | POA: Diagnosis not present

## 2018-09-14 DIAGNOSIS — S335XXA Sprain of ligaments of lumbar spine, initial encounter: Secondary | ICD-10-CM | POA: Diagnosis not present

## 2018-09-14 DIAGNOSIS — M9903 Segmental and somatic dysfunction of lumbar region: Secondary | ICD-10-CM | POA: Diagnosis not present

## 2018-09-20 DIAGNOSIS — S335XXA Sprain of ligaments of lumbar spine, initial encounter: Secondary | ICD-10-CM | POA: Diagnosis not present

## 2018-09-20 DIAGNOSIS — M9903 Segmental and somatic dysfunction of lumbar region: Secondary | ICD-10-CM | POA: Diagnosis not present

## 2018-09-22 DIAGNOSIS — S335XXA Sprain of ligaments of lumbar spine, initial encounter: Secondary | ICD-10-CM | POA: Diagnosis not present

## 2018-09-22 DIAGNOSIS — M9903 Segmental and somatic dysfunction of lumbar region: Secondary | ICD-10-CM | POA: Diagnosis not present

## 2018-09-27 DIAGNOSIS — S335XXA Sprain of ligaments of lumbar spine, initial encounter: Secondary | ICD-10-CM | POA: Diagnosis not present

## 2018-09-27 DIAGNOSIS — M9903 Segmental and somatic dysfunction of lumbar region: Secondary | ICD-10-CM | POA: Diagnosis not present

## 2018-09-29 DIAGNOSIS — S335XXA Sprain of ligaments of lumbar spine, initial encounter: Secondary | ICD-10-CM | POA: Diagnosis not present

## 2018-09-29 DIAGNOSIS — M9903 Segmental and somatic dysfunction of lumbar region: Secondary | ICD-10-CM | POA: Diagnosis not present

## 2018-10-04 DIAGNOSIS — M9903 Segmental and somatic dysfunction of lumbar region: Secondary | ICD-10-CM | POA: Diagnosis not present

## 2018-10-04 DIAGNOSIS — S335XXA Sprain of ligaments of lumbar spine, initial encounter: Secondary | ICD-10-CM | POA: Diagnosis not present

## 2018-10-06 DIAGNOSIS — M9903 Segmental and somatic dysfunction of lumbar region: Secondary | ICD-10-CM | POA: Diagnosis not present

## 2018-10-06 DIAGNOSIS — S335XXA Sprain of ligaments of lumbar spine, initial encounter: Secondary | ICD-10-CM | POA: Diagnosis not present

## 2018-10-11 DIAGNOSIS — S335XXA Sprain of ligaments of lumbar spine, initial encounter: Secondary | ICD-10-CM | POA: Diagnosis not present

## 2018-10-11 DIAGNOSIS — M9903 Segmental and somatic dysfunction of lumbar region: Secondary | ICD-10-CM | POA: Diagnosis not present

## 2018-10-12 DIAGNOSIS — R030 Elevated blood-pressure reading, without diagnosis of hypertension: Secondary | ICD-10-CM | POA: Diagnosis not present

## 2018-10-12 DIAGNOSIS — Z6837 Body mass index (BMI) 37.0-37.9, adult: Secondary | ICD-10-CM | POA: Diagnosis not present

## 2018-10-12 DIAGNOSIS — M4150 Other secondary scoliosis, site unspecified: Secondary | ICD-10-CM | POA: Diagnosis not present

## 2018-10-13 DIAGNOSIS — S335XXA Sprain of ligaments of lumbar spine, initial encounter: Secondary | ICD-10-CM | POA: Diagnosis not present

## 2018-10-13 DIAGNOSIS — M9903 Segmental and somatic dysfunction of lumbar region: Secondary | ICD-10-CM | POA: Diagnosis not present

## 2018-10-14 DIAGNOSIS — H524 Presbyopia: Secondary | ICD-10-CM | POA: Diagnosis not present

## 2018-10-14 DIAGNOSIS — H52223 Regular astigmatism, bilateral: Secondary | ICD-10-CM | POA: Diagnosis not present

## 2018-10-14 DIAGNOSIS — H1789 Other corneal scars and opacities: Secondary | ICD-10-CM | POA: Diagnosis not present

## 2018-10-14 DIAGNOSIS — H5213 Myopia, bilateral: Secondary | ICD-10-CM | POA: Diagnosis not present

## 2018-10-18 ENCOUNTER — Other Ambulatory Visit (HOSPITAL_COMMUNITY): Payer: Self-pay | Admitting: Neurosurgery

## 2018-10-18 DIAGNOSIS — M4150 Other secondary scoliosis, site unspecified: Secondary | ICD-10-CM

## 2018-10-18 DIAGNOSIS — S335XXA Sprain of ligaments of lumbar spine, initial encounter: Secondary | ICD-10-CM | POA: Diagnosis not present

## 2018-10-18 DIAGNOSIS — M9903 Segmental and somatic dysfunction of lumbar region: Secondary | ICD-10-CM | POA: Diagnosis not present

## 2018-10-20 DIAGNOSIS — M9903 Segmental and somatic dysfunction of lumbar region: Secondary | ICD-10-CM | POA: Diagnosis not present

## 2018-10-20 DIAGNOSIS — S335XXA Sprain of ligaments of lumbar spine, initial encounter: Secondary | ICD-10-CM | POA: Diagnosis not present

## 2018-10-26 DIAGNOSIS — M9903 Segmental and somatic dysfunction of lumbar region: Secondary | ICD-10-CM | POA: Diagnosis not present

## 2018-10-26 DIAGNOSIS — S335XXA Sprain of ligaments of lumbar spine, initial encounter: Secondary | ICD-10-CM | POA: Diagnosis not present

## 2018-11-02 DIAGNOSIS — S335XXA Sprain of ligaments of lumbar spine, initial encounter: Secondary | ICD-10-CM | POA: Diagnosis not present

## 2018-11-02 DIAGNOSIS — M9903 Segmental and somatic dysfunction of lumbar region: Secondary | ICD-10-CM | POA: Diagnosis not present

## 2018-11-13 ENCOUNTER — Ambulatory Visit (HOSPITAL_COMMUNITY): Payer: PPO

## 2018-11-15 ENCOUNTER — Encounter (HOSPITAL_COMMUNITY): Payer: Self-pay | Admitting: *Deleted

## 2018-11-15 ENCOUNTER — Other Ambulatory Visit: Payer: Self-pay

## 2018-11-15 ENCOUNTER — Other Ambulatory Visit (HOSPITAL_COMMUNITY)
Admission: RE | Admit: 2018-11-15 | Discharge: 2018-11-15 | Disposition: A | Discharge status: Home / Self Care | Payer: PPO | Source: Ambulatory Visit | Attending: Neurosurgery | Admitting: Neurosurgery

## 2018-11-15 ENCOUNTER — Other Ambulatory Visit (HOSPITAL_COMMUNITY): Payer: PPO

## 2018-11-15 DIAGNOSIS — Z20828 Contact with and (suspected) exposure to other viral communicable diseases: Secondary | ICD-10-CM | POA: Diagnosis not present

## 2018-11-15 DIAGNOSIS — Z01812 Encounter for preprocedural laboratory examination: Secondary | ICD-10-CM | POA: Insufficient documentation

## 2018-11-15 LAB — SARS CORONAVIRUS 2 (TAT 6-24 HRS): SARS Coronavirus 2: NEGATIVE

## 2018-11-15 NOTE — Progress Notes (Signed)
Patient denies shortness of breath, fever, cough and chest pain.  PCP - Dr Kelton Pillar Cardiologist - Denies  Chest x-ray - Denies EKG - Denies Stress Test - Denies ECHO - Denies Cardiac Cath - Denies  Anesthesia review: No  STOP now taking any Aspirin (unless otherwise instructed by your surgeon), Aleve, Naproxen, Ibuprofen, Motrin, Advil, Goody's, BC's, all herbal medications, fish oil, and all vitamins.   Coronavirus Screening Have you or friend Hassan Rowan experienced the following symptoms:  Cough yes/no: No Fever (>100.29F)  yes/no: No Runny nose yes/no: No Sore throat yes/no: No Difficulty breathing/shortness of breath  yes/no: No  Have you or friend Hassan Rowan traveled in the last 14 days and where? yes/no: No

## 2018-11-15 NOTE — Anesthesia Preprocedure Evaluation (Addendum)
Anesthesia Evaluation  Patient identified by MRN, date of birth, ID band Patient awake    Reviewed: Allergy & Precautions, NPO status , Patient's Chart, lab work & pertinent test results  History of Anesthesia Complications (+) PONV, PROLONGED EMERGENCE and history of anesthetic complications  Airway Mallampati: II  TM Distance: >3 FB Neck ROM: Full    Dental  (+) Dental Advisory Given   Pulmonary neg pulmonary ROS,    breath sounds clear to auscultation       Cardiovascular negative cardio ROS   Rhythm:Regular Rate:Normal     Neuro/Psych Scoliosis     GI/Hepatic negative GI ROS, (+)     substance abuse (chronic pain and fentanyl patch)  ,   Endo/Other  negative endocrine ROS  Renal/GU negative Renal ROS     Musculoskeletal  (+) Arthritis ,   Abdominal   Peds  Hematology negative hematology ROS (+)   Anesthesia Other Findings   Reproductive/Obstetrics                            Lab Results  Component Value Date   WBC 4.6 10/25/2013   HGB 12.6 10/25/2013   HCT 39.3 10/25/2013   MCV 87.9 10/25/2013   PLT 210 10/25/2013   Lab Results  Component Value Date   CREATININE 0.60 10/25/2013   BUN 12 10/25/2013   NA 143 10/25/2013   K 3.8 10/25/2013   CL 104 10/25/2013   CO2 28 10/25/2013    Anesthesia Physical Anesthesia Plan  ASA: II  Anesthesia Plan: General   Post-op Pain Management:    Induction: Intravenous  PONV Risk Score and Plan: 3 and Dexamethasone, Ondansetron and Treatment may vary due to age or medical condition  Airway Management Planned: Oral ETT and LMA  Additional Equipment:   Intra-op Plan:   Post-operative Plan: Extubation in OR  Informed Consent: I have reviewed the patients History and Physical, chart, labs and discussed the procedure including the risks, benefits and alternatives for the proposed anesthesia with the patient or authorized  representative who has indicated his/her understanding and acceptance.     Dental advisory given  Plan Discussed with: CRNA  Anesthesia Plan Comments:        Anesthesia Quick Evaluation

## 2018-11-16 ENCOUNTER — Ambulatory Visit (HOSPITAL_COMMUNITY)
Admission: RE | Admit: 2018-11-16 | Discharge: 2018-11-16 | Disposition: A | Discharge status: Home / Self Care | Payer: PPO | Attending: Pediatrics | Admitting: Pediatrics

## 2018-11-16 ENCOUNTER — Ambulatory Visit (HOSPITAL_COMMUNITY): Payer: PPO | Admitting: Certified Registered"

## 2018-11-16 ENCOUNTER — Ambulatory Visit (HOSPITAL_COMMUNITY)
Admission: RE | Admit: 2018-11-16 | Discharge: 2018-11-16 | Disposition: A | Discharge status: Home / Self Care | Payer: PPO | Source: Ambulatory Visit | Attending: Neurosurgery | Admitting: Neurosurgery

## 2018-11-16 ENCOUNTER — Other Ambulatory Visit: Payer: Self-pay

## 2018-11-16 ENCOUNTER — Encounter (HOSPITAL_COMMUNITY): Payer: Self-pay

## 2018-11-16 ENCOUNTER — Encounter (HOSPITAL_COMMUNITY)
Admission: RE | Disposition: A | Discharge status: Home / Self Care | Payer: Self-pay | Source: Home / Self Care | Attending: Pediatrics

## 2018-11-16 DIAGNOSIS — M48061 Spinal stenosis, lumbar region without neurogenic claudication: Secondary | ICD-10-CM | POA: Insufficient documentation

## 2018-11-16 DIAGNOSIS — M47816 Spondylosis without myelopathy or radiculopathy, lumbar region: Secondary | ICD-10-CM | POA: Insufficient documentation

## 2018-11-16 DIAGNOSIS — M419 Scoliosis, unspecified: Secondary | ICD-10-CM | POA: Diagnosis not present

## 2018-11-16 DIAGNOSIS — M47817 Spondylosis without myelopathy or radiculopathy, lumbosacral region: Secondary | ICD-10-CM | POA: Insufficient documentation

## 2018-11-16 DIAGNOSIS — M4150 Other secondary scoliosis, site unspecified: Secondary | ICD-10-CM | POA: Insufficient documentation

## 2018-11-16 DIAGNOSIS — M4807 Spinal stenosis, lumbosacral region: Secondary | ICD-10-CM | POA: Diagnosis not present

## 2018-11-16 DIAGNOSIS — M4186 Other forms of scoliosis, lumbar region: Secondary | ICD-10-CM | POA: Diagnosis not present

## 2018-11-16 DIAGNOSIS — M5136 Other intervertebral disc degeneration, lumbar region: Secondary | ICD-10-CM | POA: Diagnosis not present

## 2018-11-16 HISTORY — PX: RADIOLOGY WITH ANESTHESIA: SHX6223

## 2018-11-16 SURGERY — MRI WITH ANESTHESIA
Anesthesia: General

## 2018-11-16 MED ORDER — LACTATED RINGERS IV SOLN
INTRAVENOUS | Status: DC
  Administered 2018-11-16: 07:00:00 via INTRAVENOUS

## 2018-11-16 MED FILL — Lidocaine HCl Local Soln Prefilled Syringe 100 MG/5ML (2%): INTRAMUSCULAR | Qty: 5 | Status: AC

## 2018-11-16 MED FILL — Propofol IV Emul 200 MG/20ML (10 MG/ML): INTRAVENOUS | Qty: 20 | Status: AC

## 2018-11-16 MED FILL — Fentanyl Citrate Preservative Free (PF) Inj 100 MCG/2ML: INTRAMUSCULAR | Qty: 2 | Status: AC

## 2018-11-16 NOTE — H&P (Signed)
Anesthesia H&P Update: History and Physical Exam reviewed; patient is OK for planned anesthetic and procedure. ? ?

## 2018-11-16 NOTE — Progress Notes (Signed)
H&P dated 7/24 on file.. Call placed to Dr Lacy Duverney office answering service requesting an updated H&P.

## 2018-11-16 NOTE — Transfer of Care (Signed)
Immediate Anesthesia Transfer of Care Note  Patient: Carmen Strickland  Procedure(s) Performed: MRI WITH ANESTHESIA LUMBAR WITHOUT CONTRAST (N/A )  Patient Location: PACU  Anesthesia Type:General  Level of Consciousness: drowsy and patient cooperative  Airway & Oxygen Therapy: Patient Spontanous Breathing  Post-op Assessment: Report given to RN, Post -op Vital signs reviewed and stable and Patient moving all extremities  Post vital signs: Reviewed and stable  Last Vitals:  Vitals Value Taken Time  BP 146/91 11/16/18 0950  Temp 37 C 11/16/18 0950  Pulse 70 11/16/18 0951  Resp 7 11/16/18 0951  SpO2 94 % 11/16/18 0951  Vitals shown include unvalidated device data.  Last Pain:  Vitals:   11/16/18 0634  PainSc: 0-No pain         Complications: No apparent anesthesia complications

## 2018-11-16 NOTE — Anesthesia Procedure Notes (Signed)
Procedure Name: LMA Insertion Date/Time: 11/16/2018 9:10 AM Performed by: Moshe Salisbury, CRNA Pre-anesthesia Checklist: Patient identified, Emergency Drugs available, Suction available and Patient being monitored Patient Re-evaluated:Patient Re-evaluated prior to induction Oxygen Delivery Method: Circle System Utilized Preoxygenation: Pre-oxygenation with 100% oxygen Induction Type: IV induction Ventilation: Mask ventilation without difficulty LMA: LMA inserted LMA Size: 4.0 Number of attempts: 1 Placement Confirmation: positive ETCO2 Tube secured with: Tape Dental Injury: Teeth and Oropharynx as per pre-operative assessment

## 2018-11-16 NOTE — Anesthesia Postprocedure Evaluation (Signed)
Anesthesia Post Note  Patient: Carmen Strickland  Procedure(s) Performed: MRI WITH ANESTHESIA LUMBAR WITHOUT CONTRAST (N/A )     Patient location during evaluation: PACU Anesthesia Type: General Level of consciousness: awake and alert Pain management: pain level controlled Vital Signs Assessment: post-procedure vital signs reviewed and stable Respiratory status: spontaneous breathing, nonlabored ventilation, respiratory function stable and patient connected to nasal cannula oxygen Cardiovascular status: blood pressure returned to baseline and stable Postop Assessment: no apparent nausea or vomiting Anesthetic complications: no    Last Vitals:  Vitals:   11/16/18 1004 11/16/18 1005  BP:  (!) 145/95  Pulse: 71   Resp: 10 18  Temp: 36.6 C   SpO2: 96%     Last Pain:  Vitals:   11/16/18 0950  PainSc: 2                  Tiajuana Amass

## 2018-11-16 NOTE — H&P (Signed)
Critical Care H&P/Consult  HPI: Pt presenting [facility] for [facility] of spine. No new complaints other than chronic back pain 2/10.  Patient Active Problem List   Diagnosis Date Noted  . 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 Anxiety   . Arthritis   . Chronic back pain    scoliosis and stenosis. Buldging disc  . Complication of anesthesia    pt states easily sedated   . Depression    takes Prozac daily  . Developmental displacement hip   . History of blood transfusion   . Joint pain   . Joint swelling   . Osteoporosis   . PONV (postoperative nausea and vomiting)    as a child, no problems as an adult with PONV  . Urinary urgency     Past Surgical History:  Procedure Laterality Date  . CARPAL TUNNEL RELEASE Bilateral   . COLONOSCOPY WITH PROPOFOL N/A 12/31/2015   Procedure: COLONOSCOPY WITH PROPOFOL;  Surgeon: Arta Silence, MD;  Location: [facility] ENDOSCOPY;  Service: Endoscopy;  Laterality: N/A;  . cyst removed from left breast    . EYE SURGERY     Lasik  . HIP SURGERY     x 9 as a child  . JOINT REPLACEMENT     bil.hips/bil knees/ bil shoulders   total of 6 surg  . right knee surgery  at age 31  . TOTAL SHOULDER ARTHROPLASTY Right 03/03/2013   Procedure: RIGHT TOTAL SHOULDER ARTHROPLASTY;  Surgeon: Marin Shutter, MD;  Location: [facility];  Service: Orthopedics;  Laterality: Right;  . TOTAL SHOULDER ARTHROPLASTY Left 11/03/2013   Procedure: LEFT TOTAL SHOULDER ARTHROPLASTY;  Surgeon: Marin Shutter, MD;  Location: [place] [facility];  Service: Orthopedics;  Laterality: Left;  . TOTAL SHOULDER REPLACEMENT Right 03/03/2013   DR SUPPLE   . WISDOM TOOTH EXTRACTION      Medications Prior to Admission  Medication Sig Dispense Refill Last Dose  . acetaminophen (TYLENOL) 500 MG tablet Take 1,000 mg by mouth every 6 (six) hours as needed for mild pain or headache.   Past Week at Unknown time  . Ascorbic Acid (VITAMIN C) 1000 MG tablet Take 2,000 mg by mouth daily.     11/15/2018 at Unknown time  . Cholecalciferol (VITAMIN D) 125 MCG (5000 UT) CAPS Take 5,000 Units by mouth daily.   11/15/2018 at Unknown time  . diclofenac (VOLTAREN) 75 MG EC tablet Take 75 mg by mouth 2 (two) times daily.   11/15/2018 at Unknown time  . DULoxetine (CYMBALTA) 30 MG capsule Take 30 mg by mouth daily.   11/16/2018 at [facility]  . fentaNYL (DURAGESIC) 100 MCG/HR Place 1 patch onto the skin every 3 (three) days.   11/15/2018 at Unknown time  . magnesium oxide (MAG-OX) 400 MG tablet Take 400 mg by mouth daily.   11/15/2018 at Unknown time  . metaxalone (SKELAXIN) 800 MG tablet Take 800 mg by mouth 3 (three) times daily as needed for muscle spasms.   Past Week at Unknown time  . Multiple Vitamin (MULTIVITAMIN WITH MINERALS) TABS tablet Take 1 tablet by mouth daily.   11/15/2018 at Unknown time  . Oxycodone HCl 10 MG TABS Take 10 mg by mouth 4 (four) times daily as needed (pain).   11/16/2018 at 0515   No Known Allergies  Social History   Tobacco Use  . Smoking status: Never Smoker  . Smokeless tobacco: Never Used  Substance Use Topics  . Alcohol use:  Yes    Alcohol/week: 14.0 standard drinks    Types: 14 Glasses of wine per week    Comment:  2 glasses of wine    Family History  Problem Relation Age of Onset  . Multiple sclerosis Mother   . Breast cancer Mother      Review of Systems: A comprehensive review of systems was negative.  Scheduled Meds: Continuous Infusions: . lactated ringers 10 mL/hr at 11/16/18 0716   PRN Meds:  Objective:  Vital signs in last 24 hours: Temp:  [37.1 C] 37.1 C (08/25 0617) Pulse Rate:  [76] 76 (08/25 0617) Resp:  [20] 20 (08/25 0617) BP: (152)/(87) 152/87 (08/25 0617) SpO2:  [97 %] 97 % (08/25 0617) Weight:  [88.5 kg] 88.5 kg (08/25 0634)  Intake/Output last 3 shifts: No intake/output data recorded. Intake/Output this shift: No intake/output data recorded.  Vent settings for last 24 hours:    Hemodynamic parameters for last 24  hours:    BP (!) 152/87   Pulse 76   Temp 37.1 C   Resp 20   Ht 5\' 2"  (1.575 m)   Wt 88.5 kg   LMP  (LMP Unknown)   SpO2 97%   BMI 35.67 kg/m   General Appearance:    Alert, cooperative, no distress, appears stated age  Head:    Normocephalic, without obvious abnormality, atraumatic           Throat:   Lips, mucosa, and tongue normal; teeth and gums normal        Lungs:     Clear to auscultation bilaterally, respirations unlabored  Chest Wall:    No tenderness or deformity   Heart:    Regular rate and rhythm, S1 and S2 normal, no murmur, rub   or gallop     Abdomen:     Soft, non-tender, bowel sounds active all four quadrants,    no masses, no organomegaly                         Assessment/Plan: 53yo female here for MRI Spine at request of neurosurgery.  Active Problems:   * No active hospital problems. *    Critical Care Time greater than: 15 Minutes Total time spent with patient greater than: 15 Minutes

## 2018-11-18 DIAGNOSIS — Z6837 Body mass index (BMI) 37.0-37.9, adult: Secondary | ICD-10-CM | POA: Diagnosis not present

## 2018-11-18 DIAGNOSIS — M4150 Other secondary scoliosis, site unspecified: Secondary | ICD-10-CM | POA: Diagnosis not present

## 2018-11-18 DIAGNOSIS — R030 Elevated blood-pressure reading, without diagnosis of hypertension: Secondary | ICD-10-CM | POA: Diagnosis not present

## 2018-11-22 ENCOUNTER — Encounter (HOSPITAL_COMMUNITY): Payer: Self-pay | Admitting: Radiology

## 2018-11-25 DIAGNOSIS — M4316 Spondylolisthesis, lumbar region: Secondary | ICD-10-CM | POA: Diagnosis not present

## 2018-11-25 DIAGNOSIS — M4150 Other secondary scoliosis, site unspecified: Secondary | ICD-10-CM | POA: Diagnosis not present

## 2018-11-25 DIAGNOSIS — M5136 Other intervertebral disc degeneration, lumbar region: Secondary | ICD-10-CM | POA: Diagnosis not present

## 2018-11-25 DIAGNOSIS — M545 Low back pain: Secondary | ICD-10-CM | POA: Diagnosis not present

## 2018-12-13 ENCOUNTER — Other Ambulatory Visit (HOSPITAL_COMMUNITY): Payer: Self-pay | Admitting: Neurosurgery

## 2018-12-13 ENCOUNTER — Other Ambulatory Visit: Payer: Self-pay | Admitting: Neurosurgery

## 2018-12-13 DIAGNOSIS — M4150 Other secondary scoliosis, site unspecified: Secondary | ICD-10-CM

## 2018-12-21 ENCOUNTER — Other Ambulatory Visit: Payer: Self-pay

## 2018-12-21 ENCOUNTER — Ambulatory Visit (HOSPITAL_COMMUNITY)
Admission: RE | Admit: 2018-12-21 | Discharge: 2018-12-21 | Disposition: A | Discharge status: Home / Self Care | Payer: PPO | Source: Ambulatory Visit | Attending: Neurosurgery | Admitting: Neurosurgery

## 2018-12-21 DIAGNOSIS — M4150 Other secondary scoliosis, site unspecified: Secondary | ICD-10-CM | POA: Insufficient documentation

## 2018-12-21 DIAGNOSIS — M4185 Other forms of scoliosis, thoracolumbar region: Secondary | ICD-10-CM | POA: Diagnosis not present

## 2018-12-22 ENCOUNTER — Other Ambulatory Visit (HOSPITAL_COMMUNITY): Payer: Self-pay | Admitting: Neurosurgery

## 2018-12-22 ENCOUNTER — Other Ambulatory Visit (HOSPITAL_COMMUNITY): Payer: Self-pay | Admitting: Interventional Radiology

## 2018-12-22 DIAGNOSIS — M4150 Other secondary scoliosis, site unspecified: Secondary | ICD-10-CM

## 2018-12-23 DIAGNOSIS — M4180 Other forms of scoliosis, site unspecified: Secondary | ICD-10-CM | POA: Diagnosis not present

## 2019-01-06 NOTE — Progress Notes (Addendum)
I called Carmen Strickland and left a voice message asking for  her to call me with the name and number of the MRI scheduler in their office that schedules MRI to be done outside their office. Carmen Strickland called and said the Dr that ordered the MRI is at the [facility] office and gave me the number [phone], Carmen Strickland,  I called and left a message. Carmen Strickland called back and asked for the fax number to send  information, I emailed MRI scheduler. I did not get a answer regarding fax number; I called and left numbers on [name]'s voice mail; [phone] and [phone]- the number I found in [employer] [facility] scheduler.

## 2019-01-08 ENCOUNTER — Other Ambulatory Visit (HOSPITAL_COMMUNITY)
Admission: RE | Admit: 2019-01-08 | Discharge: 2019-01-08 | Disposition: A | Discharge status: Home / Self Care | Payer: PPO | Source: Ambulatory Visit | Attending: Neurosurgery | Admitting: Neurosurgery

## 2019-01-08 DIAGNOSIS — Z01812 Encounter for preprocedural laboratory examination: Secondary | ICD-10-CM | POA: Insufficient documentation

## 2019-01-08 DIAGNOSIS — Z20828 Contact with and (suspected) exposure to other viral communicable diseases: Secondary | ICD-10-CM | POA: Diagnosis not present

## 2019-01-09 LAB — NOVEL CORONAVIRUS, NAA (HOSP ORDER, SEND-OUT TO REF LAB; TAT 18-24 HRS): SARS-CoV-2, NAA: NOT DETECTED

## 2019-01-10 ENCOUNTER — Encounter (HOSPITAL_COMMUNITY): Payer: Self-pay | Admitting: *Deleted

## 2019-01-10 NOTE — Anesthesia Preprocedure Evaluation (Addendum)
Anesthesia Evaluation  Patient identified by MRN, date of birth, ID band Patient awake    Reviewed: Allergy & Precautions, NPO status , Patient's Chart, lab work & pertinent test results  History of Anesthesia Complications (+) PONV, PROLONGED EMERGENCE and history of anesthetic complications  Airway Mallampati: II   Neck ROM: Full    Dental no notable dental hx. (+) Dental Advisory Given, Teeth Intact   Pulmonary neg pulmonary ROS,    Pulmonary exam normal breath sounds clear to auscultation       Cardiovascular negative cardio ROS Normal cardiovascular exam Rhythm:Regular Rate:Normal     Neuro/Psych PSYCHIATRIC DISORDERS Anxiety Depression Scoliosis  negative neurological ROS     GI/Hepatic negative GI ROS, (+)     substance abuse (chronic pain and fentanyl patch)  ,   Endo/Other  negative endocrine ROS  Renal/GU negative Renal ROS     Musculoskeletal  (+) Arthritis ,   Abdominal (+) + obese,   Peds  Hematology negative hematology ROS (+)   Anesthesia Other Findings   Reproductive/Obstetrics                            Lab Results  Component Value Date   WBC 4.6 10/25/2013   HGB 12.6 10/25/2013   HCT 39.3 10/25/2013   MCV 87.9 10/25/2013   PLT 210 10/25/2013   Lab Results  Component Value Date   CREATININE 0.60 10/25/2013   BUN 12 10/25/2013   NA 143 10/25/2013   K 3.8 10/25/2013   CL 104 10/25/2013   CO2 28 10/25/2013    Anesthesia Physical  Anesthesia Plan  ASA: II  Anesthesia Plan: General   Post-op Pain Management:    Induction: Intravenous  PONV Risk Score and Plan: 3 and Dexamethasone, Ondansetron and Treatment may vary due to age or medical condition  Airway Management Planned: Oral ETT  Additional Equipment: None  Intra-op Plan:   Post-operative Plan: Extubation in OR  Informed Consent: I have reviewed the patients History and Physical, chart,  labs and discussed the procedure including the risks, benefits and alternatives for the proposed anesthesia with the patient or authorized representative who has indicated his/her understanding and acceptance.     Dental advisory given  Plan Discussed with: CRNA  Anesthesia Plan Comments:       Anesthesia Quick Evaluation

## 2019-01-10 NOTE — Progress Notes (Signed)
Denies chest pain, shob, or cardiology visit. Reports that she has been following quar tine instructions.

## 2019-01-11 ENCOUNTER — Other Ambulatory Visit: Payer: Self-pay

## 2019-01-11 ENCOUNTER — Encounter (HOSPITAL_COMMUNITY): Admission: RE | Disposition: A | Discharge status: Home / Self Care | Payer: Self-pay | Source: Home / Self Care

## 2019-01-11 ENCOUNTER — Ambulatory Visit (HOSPITAL_COMMUNITY): Payer: PPO | Admitting: Vascular Surgery

## 2019-01-11 ENCOUNTER — Ambulatory Visit (HOSPITAL_COMMUNITY)
Admission: RE | Admit: 2019-01-11 | Discharge: 2019-01-11 | Disposition: A | Discharge status: Home / Self Care | Payer: PPO | Source: Ambulatory Visit | Attending: Neurosurgery | Admitting: Neurosurgery

## 2019-01-11 ENCOUNTER — Ambulatory Visit (HOSPITAL_COMMUNITY)
Admission: RE | Admit: 2019-01-11 | Discharge: 2019-01-11 | Disposition: A | Discharge status: Home / Self Care | Payer: PPO | Attending: Neurosurgery | Admitting: Neurosurgery

## 2019-01-11 ENCOUNTER — Encounter (HOSPITAL_COMMUNITY): Payer: Self-pay

## 2019-01-11 DIAGNOSIS — Z79891 Long term (current) use of opiate analgesic: Secondary | ICD-10-CM | POA: Insufficient documentation

## 2019-01-11 DIAGNOSIS — F329 Major depressive disorder, single episode, unspecified: Secondary | ICD-10-CM | POA: Diagnosis not present

## 2019-01-11 DIAGNOSIS — M4185 Other forms of scoliosis, thoracolumbar region: Secondary | ICD-10-CM | POA: Diagnosis not present

## 2019-01-11 DIAGNOSIS — M4150 Other secondary scoliosis, site unspecified: Secondary | ICD-10-CM

## 2019-01-11 DIAGNOSIS — M4180 Other forms of scoliosis, site unspecified: Secondary | ICD-10-CM | POA: Insufficient documentation

## 2019-01-11 DIAGNOSIS — Z79899 Other long term (current) drug therapy: Secondary | ICD-10-CM | POA: Insufficient documentation

## 2019-01-11 DIAGNOSIS — M1990 Unspecified osteoarthritis, unspecified site: Secondary | ICD-10-CM | POA: Diagnosis not present

## 2019-01-11 DIAGNOSIS — M4184 Other forms of scoliosis, thoracic region: Secondary | ICD-10-CM | POA: Diagnosis not present

## 2019-01-11 DIAGNOSIS — M48061 Spinal stenosis, lumbar region without neurogenic claudication: Secondary | ICD-10-CM | POA: Diagnosis not present

## 2019-01-11 DIAGNOSIS — M47815 Spondylosis without myelopathy or radiculopathy, thoracolumbar region: Secondary | ICD-10-CM | POA: Diagnosis not present

## 2019-01-11 DIAGNOSIS — F418 Other specified anxiety disorders: Secondary | ICD-10-CM | POA: Diagnosis not present

## 2019-01-11 DIAGNOSIS — M4804 Spinal stenosis, thoracic region: Secondary | ICD-10-CM | POA: Diagnosis not present

## 2019-01-11 DIAGNOSIS — F419 Anxiety disorder, unspecified: Secondary | ICD-10-CM | POA: Diagnosis not present

## 2019-01-11 DIAGNOSIS — M5124 Other intervertebral disc displacement, thoracic region: Secondary | ICD-10-CM | POA: Diagnosis not present

## 2019-01-11 DIAGNOSIS — M546 Pain in thoracic spine: Secondary | ICD-10-CM | POA: Diagnosis present

## 2019-01-11 HISTORY — PX: RADIOLOGY WITH ANESTHESIA: SHX6223

## 2019-01-11 SURGERY — MRI WITH ANESTHESIA
Anesthesia: General

## 2019-01-11 MED ORDER — LACTATED RINGERS IV SOLN
INTRAVENOUS | Status: DC
  Administered 2019-01-11: 08:00:00 via INTRAVENOUS

## 2019-01-11 NOTE — Transfer of Care (Signed)
Immediate Anesthesia Transfer of Care Note  Patient: Cabrina Dahlgren  Procedure(s) Performed: MRI THORACIC SPINE WITHOUT CONTRAST (N/A )  Patient Location: PACU  Anesthesia Type:General  Level of Consciousness: awake and patient cooperative  Airway & Oxygen Therapy: Patient Spontanous Breathing and Patient connected to nasal cannula oxygen  Post-op Assessment: Report given to RN and Post -op Vital signs reviewed and stable  Post vital signs: Reviewed and stable  Last Vitals:  Vitals Value Taken Time  BP 155/84 01/11/19 0952  Temp 36.1 C 01/11/19 0952  Pulse 88 01/11/19 0952  Resp 10 01/11/19 0952  SpO2      Last Pain:  Vitals:   01/11/19 0952  TempSrc:   PainSc: 0-No pain         Complications: No apparent anesthesia complications

## 2019-01-11 NOTE — Anesthesia Postprocedure Evaluation (Signed)
Anesthesia Post Note  Patient: Carmen Strickland  Procedure(s) Performed: MRI THORACIC SPINE WITHOUT CONTRAST (N/A )     Patient location during evaluation: PACU Anesthesia Type: General Level of consciousness: awake Pain management: pain level controlled Vital Signs Assessment: post-procedure vital signs reviewed and stable Respiratory status: spontaneous breathing Cardiovascular status: stable Postop Assessment: no apparent nausea or vomiting Anesthetic complications: no    Last Vitals:  Vitals:   01/11/19 1007 01/11/19 1022  BP: (!) 141/81 (!) 141/81  Pulse: 89 83  Resp: 11 14  Temp:    SpO2: 99% 98%    Last Pain:  Vitals:   01/11/19 1022  TempSrc:   PainSc: 0-No pain   Pain Goal:                   Huston Foley

## 2019-01-12 ENCOUNTER — Encounter (HOSPITAL_COMMUNITY): Payer: Self-pay | Admitting: Radiology

## 2019-01-12 MED FILL — Sugammadex Sodium IV 200 MG/2ML (Base Equivalent): INTRAVENOUS | Qty: 2 | Status: AC

## 2019-01-12 MED FILL — Midazolam HCl Inj 2 MG/2ML (Base Equivalent): INTRAMUSCULAR | Qty: 2 | Status: AC

## 2019-01-12 MED FILL — Lidocaine HCl Local Soln Prefilled Syringe 100 MG/5ML (2%): INTRAMUSCULAR | Qty: 5 | Status: AC

## 2019-01-12 MED FILL — Fentanyl Citrate Preservative Free (PF) Inj 100 MCG/2ML: INTRAMUSCULAR | Qty: 2 | Status: AC

## 2019-01-12 MED FILL — Dexamethasone Sodium Phosphate Inj 10 MG/ML: INTRAMUSCULAR | Qty: 1 | Status: AC

## 2019-01-12 MED FILL — Lactated Ringer's Solution: INTRAVENOUS | Qty: 1000 | Status: AC

## 2019-01-12 MED FILL — Ephedrine Sulf-NaCl Soln Pref Syr 50 MG/10ML-0.9% (5 MG/ML): INTRAVENOUS | Qty: 10 | Status: AC

## 2019-01-12 MED FILL — Phenylephrine-NaCl IV Solution 40 MG/250ML-0.9%: INTRAVENOUS | Qty: 250 | Status: CN

## 2019-01-12 MED FILL — Ondansetron HCl Inj 4 MG/2ML (2 MG/ML): INTRAMUSCULAR | Qty: 2 | Status: AC

## 2019-01-12 MED FILL — Phenylephrine-NaCl Pref Syr 0.4 MG/10ML-0.9% (40 MCG/ML): INTRAVENOUS | Qty: 10 | Status: AC

## 2019-01-12 MED FILL — Propofol IV Emul 200 MG/20ML (10 MG/ML): INTRAVENOUS | Qty: 20 | Status: AC

## 2019-01-12 MED FILL — Rocuronium Bromide IV Soln Pref Syringe 50 MG/5ML (10 MG/ML): INTRAVENOUS | Qty: 6 | Status: AC

## 2019-01-20 ENCOUNTER — Other Ambulatory Visit (HOSPITAL_COMMUNITY)
Admission: RE | Admit: 2019-01-20 | Discharge: 2019-01-20 | Disposition: A | Discharge status: Home / Self Care | Payer: PPO | Source: Ambulatory Visit | Attending: Family Medicine | Admitting: Family Medicine

## 2019-01-20 ENCOUNTER — Other Ambulatory Visit: Payer: Self-pay | Admitting: Family Medicine

## 2019-01-20 DIAGNOSIS — Z124 Encounter for screening for malignant neoplasm of cervix: Secondary | ICD-10-CM | POA: Insufficient documentation

## 2019-01-20 DIAGNOSIS — K5903 Drug induced constipation: Secondary | ICD-10-CM | POA: Diagnosis not present

## 2019-01-20 DIAGNOSIS — F0630 Mood disorder due to known physiological condition, unspecified: Secondary | ICD-10-CM | POA: Diagnosis not present

## 2019-01-20 DIAGNOSIS — Z23 Encounter for immunization: Secondary | ICD-10-CM | POA: Diagnosis not present

## 2019-01-20 DIAGNOSIS — Z1389 Encounter for screening for other disorder: Secondary | ICD-10-CM | POA: Diagnosis not present

## 2019-01-20 DIAGNOSIS — E7800 Pure hypercholesterolemia, unspecified: Secondary | ICD-10-CM | POA: Diagnosis not present

## 2019-01-20 DIAGNOSIS — R2681 Unsteadiness on feet: Secondary | ICD-10-CM | POA: Diagnosis not present

## 2019-01-20 DIAGNOSIS — Z0000 Encounter for general adult medical examination without abnormal findings: Secondary | ICD-10-CM | POA: Diagnosis not present

## 2019-01-20 DIAGNOSIS — M545 Low back pain: Secondary | ICD-10-CM | POA: Diagnosis not present

## 2019-01-20 DIAGNOSIS — Z1159 Encounter for screening for other viral diseases: Secondary | ICD-10-CM | POA: Diagnosis not present

## 2019-01-24 DIAGNOSIS — G8929 Other chronic pain: Secondary | ICD-10-CM | POA: Diagnosis not present

## 2019-01-26 LAB — CYTOLOGY - PAP
Comment: NEGATIVE
Diagnosis: NEGATIVE
High risk HPV: NEGATIVE

## 2019-02-23 ENCOUNTER — Other Ambulatory Visit: Payer: Self-pay | Admitting: Family Medicine

## 2019-02-23 DIAGNOSIS — Z1231 Encounter for screening mammogram for malignant neoplasm of breast: Secondary | ICD-10-CM

## 2019-03-01 ENCOUNTER — Other Ambulatory Visit: Payer: Self-pay

## 2019-03-01 ENCOUNTER — Ambulatory Visit
Admission: RE | Admit: 2019-03-01 | Discharge: 2019-03-01 | Disposition: A | Discharge status: Home / Self Care | Payer: PPO | Source: Ambulatory Visit | Attending: Family Medicine | Admitting: Family Medicine

## 2019-03-01 DIAGNOSIS — Z1231 Encounter for screening mammogram for malignant neoplasm of breast: Secondary | ICD-10-CM | POA: Diagnosis not present

## 2019-04-19 DIAGNOSIS — M4180 Other forms of scoliosis, site unspecified: Secondary | ICD-10-CM | POA: Diagnosis not present

## 2019-04-19 DIAGNOSIS — M5136 Other intervertebral disc degeneration, lumbar region: Secondary | ICD-10-CM | POA: Diagnosis not present

## 2019-04-19 DIAGNOSIS — M4316 Spondylolisthesis, lumbar region: Secondary | ICD-10-CM | POA: Diagnosis not present

## 2019-04-19 DIAGNOSIS — M545 Low back pain: Secondary | ICD-10-CM | POA: Diagnosis not present

## 2019-04-25 DIAGNOSIS — G894 Chronic pain syndrome: Secondary | ICD-10-CM | POA: Diagnosis not present

## 2019-04-25 DIAGNOSIS — Z79899 Other long term (current) drug therapy: Secondary | ICD-10-CM | POA: Diagnosis not present

## 2019-04-25 DIAGNOSIS — M545 Low back pain: Secondary | ICD-10-CM | POA: Diagnosis not present

## 2019-05-05 DIAGNOSIS — Z20822 Contact with and (suspected) exposure to covid-19: Secondary | ICD-10-CM | POA: Diagnosis not present

## 2019-05-05 DIAGNOSIS — Z1159 Encounter for screening for other viral diseases: Secondary | ICD-10-CM | POA: Diagnosis not present

## 2019-05-10 DIAGNOSIS — Z96653 Presence of artificial knee joint, bilateral: Secondary | ICD-10-CM | POA: Diagnosis not present

## 2019-05-10 DIAGNOSIS — M419 Scoliosis, unspecified: Secondary | ICD-10-CM | POA: Diagnosis not present

## 2019-05-10 DIAGNOSIS — R9431 Abnormal electrocardiogram [ECG] [EKG]: Secondary | ICD-10-CM | POA: Diagnosis not present

## 2019-05-10 DIAGNOSIS — M545 Low back pain: Secondary | ICD-10-CM | POA: Diagnosis not present

## 2019-05-10 DIAGNOSIS — M4186 Other forms of scoliosis, lumbar region: Secondary | ICD-10-CM | POA: Diagnosis not present

## 2019-05-10 DIAGNOSIS — M79669 Pain in unspecified lower leg: Secondary | ICD-10-CM | POA: Diagnosis not present

## 2019-05-10 DIAGNOSIS — G8918 Other acute postprocedural pain: Secondary | ICD-10-CM | POA: Diagnosis not present

## 2019-05-10 DIAGNOSIS — Z96643 Presence of artificial hip joint, bilateral: Secondary | ICD-10-CM | POA: Diagnosis not present

## 2019-05-10 DIAGNOSIS — M439 Deforming dorsopathy, unspecified: Secondary | ICD-10-CM | POA: Diagnosis not present

## 2019-05-10 DIAGNOSIS — Z6841 Body Mass Index (BMI) 40.0 and over, adult: Secondary | ICD-10-CM | POA: Diagnosis not present

## 2019-05-10 DIAGNOSIS — Z20822 Contact with and (suspected) exposure to covid-19: Secondary | ICD-10-CM | POA: Diagnosis not present

## 2019-05-10 DIAGNOSIS — D696 Thrombocytopenia, unspecified: Secondary | ICD-10-CM | POA: Diagnosis not present

## 2019-05-10 DIAGNOSIS — M952 Other acquired deformity of head: Secondary | ICD-10-CM | POA: Diagnosis not present

## 2019-05-10 DIAGNOSIS — M5136 Other intervertebral disc degeneration, lumbar region: Secondary | ICD-10-CM | POA: Diagnosis not present

## 2019-05-10 DIAGNOSIS — Z8776 Personal history of (corrected) congenital malformations of integument, limbs and musculoskeletal system: Secondary | ICD-10-CM | POA: Diagnosis not present

## 2019-05-10 DIAGNOSIS — M4180 Other forms of scoliosis, site unspecified: Secondary | ICD-10-CM | POA: Diagnosis not present

## 2019-05-10 DIAGNOSIS — R000 Tachycardia, unspecified: Secondary | ICD-10-CM | POA: Diagnosis not present

## 2019-05-10 DIAGNOSIS — J939 Pneumothorax, unspecified: Secondary | ICD-10-CM | POA: Diagnosis not present

## 2019-05-10 DIAGNOSIS — M2578 Osteophyte, vertebrae: Secondary | ICD-10-CM | POA: Diagnosis not present

## 2019-05-10 DIAGNOSIS — M4316 Spondylolisthesis, lumbar region: Secondary | ICD-10-CM | POA: Diagnosis not present

## 2019-05-10 DIAGNOSIS — D649 Anemia, unspecified: Secondary | ICD-10-CM | POA: Diagnosis not present

## 2019-05-10 DIAGNOSIS — G8929 Other chronic pain: Secondary | ICD-10-CM | POA: Diagnosis not present

## 2019-05-10 DIAGNOSIS — M438X6 Other specified deforming dorsopathies, lumbar region: Secondary | ICD-10-CM | POA: Diagnosis not present

## 2019-05-10 DIAGNOSIS — D62 Acute posthemorrhagic anemia: Secondary | ICD-10-CM | POA: Diagnosis not present

## 2019-05-11 DIAGNOSIS — M439 Deforming dorsopathy, unspecified: Secondary | ICD-10-CM | POA: Diagnosis not present

## 2019-05-11 DIAGNOSIS — D649 Anemia, unspecified: Secondary | ICD-10-CM | POA: Diagnosis not present

## 2019-05-11 DIAGNOSIS — M4186 Other forms of scoliosis, lumbar region: Secondary | ICD-10-CM | POA: Diagnosis not present

## 2019-05-11 DIAGNOSIS — M952 Other acquired deformity of head: Secondary | ICD-10-CM | POA: Diagnosis not present

## 2019-05-11 DIAGNOSIS — M4316 Spondylolisthesis, lumbar region: Secondary | ICD-10-CM | POA: Diagnosis not present

## 2019-05-11 DIAGNOSIS — M419 Scoliosis, unspecified: Secondary | ICD-10-CM | POA: Diagnosis not present

## 2019-05-11 DIAGNOSIS — M5136 Other intervertebral disc degeneration, lumbar region: Secondary | ICD-10-CM | POA: Diagnosis not present

## 2019-05-11 DIAGNOSIS — G8918 Other acute postprocedural pain: Secondary | ICD-10-CM | POA: Diagnosis not present

## 2019-05-13 DIAGNOSIS — M419 Scoliosis, unspecified: Secondary | ICD-10-CM | POA: Diagnosis not present

## 2019-05-13 DIAGNOSIS — G8918 Other acute postprocedural pain: Secondary | ICD-10-CM | POA: Diagnosis not present

## 2019-05-15 DIAGNOSIS — R9431 Abnormal electrocardiogram [ECG] [EKG]: Secondary | ICD-10-CM | POA: Diagnosis not present

## 2019-05-15 DIAGNOSIS — R000 Tachycardia, unspecified: Secondary | ICD-10-CM | POA: Diagnosis not present

## 2019-05-16 DIAGNOSIS — M545 Low back pain: Secondary | ICD-10-CM | POA: Diagnosis not present

## 2019-05-16 DIAGNOSIS — G8918 Other acute postprocedural pain: Secondary | ICD-10-CM | POA: Diagnosis not present

## 2019-05-16 DIAGNOSIS — D649 Anemia, unspecified: Secondary | ICD-10-CM | POA: Diagnosis not present

## 2019-05-16 DIAGNOSIS — M419 Scoliosis, unspecified: Secondary | ICD-10-CM | POA: Diagnosis not present

## 2019-05-17 DIAGNOSIS — M419 Scoliosis, unspecified: Secondary | ICD-10-CM | POA: Diagnosis not present

## 2019-05-17 DIAGNOSIS — G8918 Other acute postprocedural pain: Secondary | ICD-10-CM | POA: Diagnosis not present

## 2019-05-17 DIAGNOSIS — D649 Anemia, unspecified: Secondary | ICD-10-CM | POA: Diagnosis not present

## 2019-05-17 DIAGNOSIS — M545 Low back pain: Secondary | ICD-10-CM | POA: Diagnosis not present

## 2019-05-19 DIAGNOSIS — Z981 Arthrodesis status: Secondary | ICD-10-CM | POA: Diagnosis not present

## 2019-05-19 DIAGNOSIS — Q6589 Other specified congenital deformities of hip: Secondary | ICD-10-CM | POA: Diagnosis not present

## 2019-05-19 DIAGNOSIS — M623 Immobility syndrome (paraplegic): Secondary | ICD-10-CM | POA: Diagnosis not present

## 2019-05-19 DIAGNOSIS — Z9181 History of falling: Secondary | ICD-10-CM | POA: Diagnosis not present

## 2019-05-19 DIAGNOSIS — Z4782 Encounter for orthopedic aftercare following scoliosis surgery: Secondary | ICD-10-CM | POA: Diagnosis not present

## 2019-05-19 DIAGNOSIS — M4186 Other forms of scoliosis, lumbar region: Secondary | ICD-10-CM | POA: Diagnosis not present

## 2019-05-19 DIAGNOSIS — G894 Chronic pain syndrome: Secondary | ICD-10-CM | POA: Diagnosis not present

## 2019-05-19 DIAGNOSIS — R269 Unspecified abnormalities of gait and mobility: Secondary | ICD-10-CM | POA: Diagnosis not present

## 2019-05-19 DIAGNOSIS — Z96653 Presence of artificial knee joint, bilateral: Secondary | ICD-10-CM | POA: Diagnosis not present

## 2019-05-19 DIAGNOSIS — M47816 Spondylosis without myelopathy or radiculopathy, lumbar region: Secondary | ICD-10-CM | POA: Diagnosis not present

## 2019-05-19 DIAGNOSIS — Z6841 Body Mass Index (BMI) 40.0 and over, adult: Secondary | ICD-10-CM | POA: Diagnosis not present

## 2019-05-19 DIAGNOSIS — Z7409 Other reduced mobility: Secondary | ICD-10-CM | POA: Diagnosis not present

## 2019-05-19 DIAGNOSIS — Z96643 Presence of artificial hip joint, bilateral: Secondary | ICD-10-CM | POA: Diagnosis not present

## 2019-05-19 DIAGNOSIS — F329 Major depressive disorder, single episode, unspecified: Secondary | ICD-10-CM | POA: Diagnosis not present

## 2019-05-19 DIAGNOSIS — S32039D Unspecified fracture of third lumbar vertebra, subsequent encounter for fracture with routine healing: Secondary | ICD-10-CM | POA: Diagnosis not present

## 2019-05-19 DIAGNOSIS — M48061 Spinal stenosis, lumbar region without neurogenic claudication: Secondary | ICD-10-CM | POA: Diagnosis not present

## 2019-05-19 DIAGNOSIS — R400 Somnolence: Secondary | ICD-10-CM | POA: Diagnosis not present

## 2019-05-19 DIAGNOSIS — M545 Low back pain: Secondary | ICD-10-CM | POA: Diagnosis not present

## 2019-05-19 DIAGNOSIS — M419 Scoliosis, unspecified: Secondary | ICD-10-CM | POA: Diagnosis not present

## 2019-05-19 DIAGNOSIS — Z87798 Personal history of other (corrected) congenital malformations: Secondary | ICD-10-CM | POA: Diagnosis not present

## 2019-05-19 DIAGNOSIS — M792 Neuralgia and neuritis, unspecified: Secondary | ICD-10-CM | POA: Diagnosis not present

## 2019-05-19 DIAGNOSIS — Z96611 Presence of right artificial shoulder joint: Secondary | ICD-10-CM | POA: Diagnosis not present

## 2019-05-19 DIAGNOSIS — M62830 Muscle spasm of back: Secondary | ICD-10-CM | POA: Diagnosis not present

## 2019-05-19 DIAGNOSIS — D6489 Other specified anemias: Secondary | ICD-10-CM | POA: Diagnosis not present

## 2019-05-19 DIAGNOSIS — Z79899 Other long term (current) drug therapy: Secondary | ICD-10-CM | POA: Diagnosis not present

## 2019-05-19 DIAGNOSIS — K5900 Constipation, unspecified: Secondary | ICD-10-CM | POA: Diagnosis not present

## 2019-05-19 DIAGNOSIS — Z96612 Presence of left artificial shoulder joint: Secondary | ICD-10-CM | POA: Diagnosis not present

## 2019-05-26 DIAGNOSIS — M62830 Muscle spasm of back: Secondary | ICD-10-CM | POA: Diagnosis not present

## 2019-05-26 DIAGNOSIS — M47816 Spondylosis without myelopathy or radiculopathy, lumbar region: Secondary | ICD-10-CM | POA: Diagnosis not present

## 2019-05-26 DIAGNOSIS — M48061 Spinal stenosis, lumbar region without neurogenic claudication: Secondary | ICD-10-CM | POA: Diagnosis not present

## 2019-05-26 DIAGNOSIS — S32039D Unspecified fracture of third lumbar vertebra, subsequent encounter for fracture with routine healing: Secondary | ICD-10-CM | POA: Diagnosis not present

## 2019-05-26 DIAGNOSIS — Z981 Arthrodesis status: Secondary | ICD-10-CM | POA: Diagnosis not present

## 2019-05-26 DIAGNOSIS — M4186 Other forms of scoliosis, lumbar region: Secondary | ICD-10-CM | POA: Diagnosis not present

## 2019-06-01 DIAGNOSIS — Z981 Arthrodesis status: Secondary | ICD-10-CM | POA: Diagnosis not present

## 2019-06-01 DIAGNOSIS — M4180 Other forms of scoliosis, site unspecified: Secondary | ICD-10-CM | POA: Diagnosis not present

## 2019-06-03 DIAGNOSIS — M545 Low back pain: Secondary | ICD-10-CM | POA: Diagnosis not present

## 2019-06-06 DIAGNOSIS — M545 Low back pain: Secondary | ICD-10-CM | POA: Diagnosis not present

## 2019-06-10 DIAGNOSIS — M545 Low back pain: Secondary | ICD-10-CM | POA: Diagnosis not present

## 2019-06-13 DIAGNOSIS — M545 Low back pain: Secondary | ICD-10-CM | POA: Diagnosis not present

## 2019-06-16 DIAGNOSIS — M545 Low back pain: Secondary | ICD-10-CM | POA: Diagnosis not present

## 2019-06-20 DIAGNOSIS — M545 Low back pain: Secondary | ICD-10-CM | POA: Diagnosis not present

## 2019-06-22 DIAGNOSIS — M4800 Spinal stenosis, site unspecified: Secondary | ICD-10-CM | POA: Diagnosis not present

## 2019-06-23 DIAGNOSIS — M419 Scoliosis, unspecified: Secondary | ICD-10-CM | POA: Diagnosis not present

## 2019-06-24 DIAGNOSIS — M545 Low back pain: Secondary | ICD-10-CM | POA: Diagnosis not present

## 2019-06-27 DIAGNOSIS — M545 Low back pain: Secondary | ICD-10-CM | POA: Diagnosis not present

## 2019-06-30 DIAGNOSIS — M545 Low back pain: Secondary | ICD-10-CM | POA: Diagnosis not present

## 2019-07-04 DIAGNOSIS — M545 Low back pain: Secondary | ICD-10-CM | POA: Diagnosis not present

## 2019-07-07 DIAGNOSIS — M545 Low back pain: Secondary | ICD-10-CM | POA: Diagnosis not present

## 2019-07-11 DIAGNOSIS — M545 Low back pain: Secondary | ICD-10-CM | POA: Diagnosis not present

## 2019-07-14 DIAGNOSIS — M545 Low back pain: Secondary | ICD-10-CM | POA: Diagnosis not present

## 2019-07-18 DIAGNOSIS — M545 Low back pain: Secondary | ICD-10-CM | POA: Diagnosis not present

## 2019-07-21 DIAGNOSIS — M545 Low back pain: Secondary | ICD-10-CM | POA: Diagnosis not present

## 2019-07-25 DIAGNOSIS — M545 Low back pain: Secondary | ICD-10-CM | POA: Diagnosis not present

## 2019-07-28 DIAGNOSIS — M545 Low back pain: Secondary | ICD-10-CM | POA: Diagnosis not present

## 2019-08-01 DIAGNOSIS — M545 Low back pain: Secondary | ICD-10-CM | POA: Diagnosis not present

## 2019-08-04 DIAGNOSIS — T84296A Other mechanical complication of internal fixation device of vertebrae, initial encounter: Secondary | ICD-10-CM | POA: Diagnosis not present

## 2019-08-05 DIAGNOSIS — M545 Low back pain: Secondary | ICD-10-CM | POA: Diagnosis not present

## 2019-08-08 DIAGNOSIS — M545 Low back pain: Secondary | ICD-10-CM | POA: Diagnosis not present

## 2019-08-11 DIAGNOSIS — M545 Low back pain: Secondary | ICD-10-CM | POA: Diagnosis not present

## 2019-08-11 DIAGNOSIS — M542 Cervicalgia: Secondary | ICD-10-CM | POA: Diagnosis not present

## 2019-08-15 DIAGNOSIS — M545 Low back pain: Secondary | ICD-10-CM | POA: Diagnosis not present

## 2019-08-19 DIAGNOSIS — M545 Low back pain: Secondary | ICD-10-CM | POA: Diagnosis not present

## 2019-08-24 DIAGNOSIS — M545 Low back pain: Secondary | ICD-10-CM | POA: Diagnosis not present

## 2019-08-24 DIAGNOSIS — M542 Cervicalgia: Secondary | ICD-10-CM | POA: Diagnosis not present

## 2019-08-26 DIAGNOSIS — M545 Low back pain: Secondary | ICD-10-CM | POA: Diagnosis not present

## 2019-08-29 DIAGNOSIS — M545 Low back pain: Secondary | ICD-10-CM | POA: Diagnosis not present

## 2019-08-29 DIAGNOSIS — M542 Cervicalgia: Secondary | ICD-10-CM | POA: Diagnosis not present

## 2019-09-02 DIAGNOSIS — M545 Low back pain: Secondary | ICD-10-CM | POA: Diagnosis not present

## 2019-09-05 DIAGNOSIS — M542 Cervicalgia: Secondary | ICD-10-CM | POA: Diagnosis not present

## 2019-09-05 DIAGNOSIS — M545 Low back pain: Secondary | ICD-10-CM | POA: Diagnosis not present

## 2019-09-09 DIAGNOSIS — M545 Low back pain: Secondary | ICD-10-CM | POA: Diagnosis not present

## 2019-09-12 DIAGNOSIS — M545 Low back pain: Secondary | ICD-10-CM | POA: Diagnosis not present

## 2019-09-12 DIAGNOSIS — M542 Cervicalgia: Secondary | ICD-10-CM | POA: Diagnosis not present

## 2019-09-16 DIAGNOSIS — M545 Low back pain: Secondary | ICD-10-CM | POA: Diagnosis not present

## 2019-09-19 DIAGNOSIS — M545 Low back pain: Secondary | ICD-10-CM | POA: Diagnosis not present

## 2019-09-20 DIAGNOSIS — G894 Chronic pain syndrome: Secondary | ICD-10-CM | POA: Diagnosis not present

## 2019-09-20 DIAGNOSIS — M25522 Pain in left elbow: Secondary | ICD-10-CM | POA: Diagnosis not present

## 2019-09-20 DIAGNOSIS — M419 Scoliosis, unspecified: Secondary | ICD-10-CM | POA: Diagnosis not present

## 2019-09-23 DIAGNOSIS — Z5181 Encounter for therapeutic drug level monitoring: Secondary | ICD-10-CM | POA: Diagnosis not present

## 2019-09-23 DIAGNOSIS — Z79899 Other long term (current) drug therapy: Secondary | ICD-10-CM | POA: Diagnosis not present

## 2019-09-23 DIAGNOSIS — M545 Low back pain: Secondary | ICD-10-CM | POA: Diagnosis not present

## 2019-09-23 DIAGNOSIS — M542 Cervicalgia: Secondary | ICD-10-CM | POA: Diagnosis not present

## 2019-09-30 DIAGNOSIS — M545 Low back pain: Secondary | ICD-10-CM | POA: Diagnosis not present

## 2019-09-30 DIAGNOSIS — M542 Cervicalgia: Secondary | ICD-10-CM | POA: Diagnosis not present

## 2019-10-06 DIAGNOSIS — M25522 Pain in left elbow: Secondary | ICD-10-CM | POA: Diagnosis not present

## 2019-10-06 DIAGNOSIS — M13822 Other specified arthritis, left elbow: Secondary | ICD-10-CM | POA: Diagnosis not present

## 2019-10-07 DIAGNOSIS — M545 Low back pain: Secondary | ICD-10-CM | POA: Diagnosis not present

## 2019-10-11 DIAGNOSIS — M545 Low back pain: Secondary | ICD-10-CM | POA: Diagnosis not present

## 2019-10-11 DIAGNOSIS — M542 Cervicalgia: Secondary | ICD-10-CM | POA: Diagnosis not present

## 2019-10-14 DIAGNOSIS — M545 Low back pain: Secondary | ICD-10-CM | POA: Diagnosis not present

## 2019-10-14 DIAGNOSIS — M542 Cervicalgia: Secondary | ICD-10-CM | POA: Diagnosis not present

## 2019-10-18 DIAGNOSIS — M542 Cervicalgia: Secondary | ICD-10-CM | POA: Diagnosis not present

## 2019-10-18 DIAGNOSIS — M545 Low back pain: Secondary | ICD-10-CM | POA: Diagnosis not present

## 2019-10-21 DIAGNOSIS — M542 Cervicalgia: Secondary | ICD-10-CM | POA: Diagnosis not present

## 2019-10-21 DIAGNOSIS — M545 Low back pain: Secondary | ICD-10-CM | POA: Diagnosis not present

## 2019-10-26 DIAGNOSIS — M545 Low back pain: Secondary | ICD-10-CM | POA: Diagnosis not present

## 2019-10-31 DIAGNOSIS — M545 Low back pain: Secondary | ICD-10-CM | POA: Diagnosis not present

## 2019-11-03 DIAGNOSIS — M13822 Other specified arthritis, left elbow: Secondary | ICD-10-CM | POA: Diagnosis not present

## 2019-11-04 DIAGNOSIS — M542 Cervicalgia: Secondary | ICD-10-CM | POA: Diagnosis not present

## 2019-11-04 DIAGNOSIS — M545 Low back pain: Secondary | ICD-10-CM | POA: Diagnosis not present

## 2019-11-07 DIAGNOSIS — M545 Low back pain: Secondary | ICD-10-CM | POA: Diagnosis not present

## 2019-11-10 DIAGNOSIS — Z981 Arthrodesis status: Secondary | ICD-10-CM | POA: Diagnosis not present

## 2019-11-10 DIAGNOSIS — M4180 Other forms of scoliosis, site unspecified: Secondary | ICD-10-CM | POA: Diagnosis not present

## 2019-11-10 DIAGNOSIS — Z6835 Body mass index (BMI) 35.0-35.9, adult: Secondary | ICD-10-CM | POA: Diagnosis not present

## 2019-11-11 DIAGNOSIS — M542 Cervicalgia: Secondary | ICD-10-CM | POA: Diagnosis not present

## 2019-11-11 DIAGNOSIS — M545 Low back pain: Secondary | ICD-10-CM | POA: Diagnosis not present

## 2019-11-14 DIAGNOSIS — M545 Low back pain: Secondary | ICD-10-CM | POA: Diagnosis not present

## 2019-11-18 DIAGNOSIS — M545 Low back pain: Secondary | ICD-10-CM | POA: Diagnosis not present

## 2019-11-18 DIAGNOSIS — M542 Cervicalgia: Secondary | ICD-10-CM | POA: Diagnosis not present

## 2019-11-21 DIAGNOSIS — M545 Low back pain: Secondary | ICD-10-CM | POA: Diagnosis not present

## 2019-11-25 DIAGNOSIS — M545 Low back pain: Secondary | ICD-10-CM | POA: Diagnosis not present

## 2019-11-30 DIAGNOSIS — M545 Low back pain: Secondary | ICD-10-CM | POA: Diagnosis not present

## 2019-12-02 DIAGNOSIS — M542 Cervicalgia: Secondary | ICD-10-CM | POA: Diagnosis not present

## 2019-12-02 DIAGNOSIS — M545 Low back pain: Secondary | ICD-10-CM | POA: Diagnosis not present

## 2019-12-05 DIAGNOSIS — M545 Low back pain: Secondary | ICD-10-CM | POA: Diagnosis not present

## 2019-12-05 DIAGNOSIS — M542 Cervicalgia: Secondary | ICD-10-CM | POA: Diagnosis not present

## 2019-12-12 DIAGNOSIS — M545 Low back pain: Secondary | ICD-10-CM | POA: Diagnosis not present

## 2019-12-13 DIAGNOSIS — M13822 Other specified arthritis, left elbow: Secondary | ICD-10-CM | POA: Diagnosis not present

## 2019-12-16 DIAGNOSIS — M545 Low back pain: Secondary | ICD-10-CM | POA: Diagnosis not present

## 2019-12-19 DIAGNOSIS — M542 Cervicalgia: Secondary | ICD-10-CM | POA: Diagnosis not present

## 2019-12-19 DIAGNOSIS — M545 Low back pain: Secondary | ICD-10-CM | POA: Diagnosis not present

## 2019-12-22 DIAGNOSIS — M545 Low back pain: Secondary | ICD-10-CM | POA: Diagnosis not present

## 2019-12-26 DIAGNOSIS — M5459 Other low back pain: Secondary | ICD-10-CM | POA: Diagnosis not present

## 2019-12-30 DIAGNOSIS — M545 Low back pain, unspecified: Secondary | ICD-10-CM | POA: Diagnosis not present

## 2020-01-02 DIAGNOSIS — M5459 Other low back pain: Secondary | ICD-10-CM | POA: Diagnosis not present

## 2020-01-06 DIAGNOSIS — M545 Low back pain, unspecified: Secondary | ICD-10-CM | POA: Diagnosis not present

## 2020-01-06 DIAGNOSIS — M542 Cervicalgia: Secondary | ICD-10-CM | POA: Diagnosis not present

## 2020-01-09 DIAGNOSIS — M542 Cervicalgia: Secondary | ICD-10-CM | POA: Diagnosis not present

## 2020-01-09 DIAGNOSIS — M545 Low back pain, unspecified: Secondary | ICD-10-CM | POA: Diagnosis not present

## 2020-01-13 DIAGNOSIS — M545 Low back pain, unspecified: Secondary | ICD-10-CM | POA: Diagnosis not present

## 2020-01-16 DIAGNOSIS — M545 Low back pain, unspecified: Secondary | ICD-10-CM | POA: Diagnosis not present

## 2020-01-20 DIAGNOSIS — M545 Low back pain, unspecified: Secondary | ICD-10-CM | POA: Diagnosis not present

## 2020-01-20 DIAGNOSIS — M542 Cervicalgia: Secondary | ICD-10-CM | POA: Diagnosis not present

## 2020-01-21 DIAGNOSIS — R21 Rash and other nonspecific skin eruption: Secondary | ICD-10-CM | POA: Diagnosis not present

## 2020-01-23 DIAGNOSIS — M545 Low back pain, unspecified: Secondary | ICD-10-CM | POA: Diagnosis not present

## 2020-01-23 DIAGNOSIS — M542 Cervicalgia: Secondary | ICD-10-CM | POA: Diagnosis not present

## 2020-01-27 DIAGNOSIS — M545 Low back pain, unspecified: Secondary | ICD-10-CM | POA: Diagnosis not present

## 2020-01-27 DIAGNOSIS — M542 Cervicalgia: Secondary | ICD-10-CM | POA: Diagnosis not present

## 2020-01-28 DIAGNOSIS — Z23 Encounter for immunization: Secondary | ICD-10-CM | POA: Diagnosis not present

## 2020-02-03 DIAGNOSIS — M545 Low back pain, unspecified: Secondary | ICD-10-CM | POA: Diagnosis not present

## 2020-02-03 DIAGNOSIS — M542 Cervicalgia: Secondary | ICD-10-CM | POA: Diagnosis not present

## 2020-02-06 DIAGNOSIS — M542 Cervicalgia: Secondary | ICD-10-CM | POA: Diagnosis not present

## 2020-02-06 DIAGNOSIS — M545 Low back pain, unspecified: Secondary | ICD-10-CM | POA: Diagnosis not present

## 2020-02-07 DIAGNOSIS — M25521 Pain in right elbow: Secondary | ICD-10-CM | POA: Diagnosis not present

## 2020-02-07 DIAGNOSIS — M13822 Other specified arthritis, left elbow: Secondary | ICD-10-CM | POA: Diagnosis not present

## 2020-02-10 DIAGNOSIS — M545 Low back pain, unspecified: Secondary | ICD-10-CM | POA: Diagnosis not present

## 2020-02-10 DIAGNOSIS — M542 Cervicalgia: Secondary | ICD-10-CM | POA: Diagnosis not present

## 2020-02-13 DIAGNOSIS — M542 Cervicalgia: Secondary | ICD-10-CM | POA: Diagnosis not present

## 2020-02-13 DIAGNOSIS — M545 Low back pain, unspecified: Secondary | ICD-10-CM | POA: Diagnosis not present

## 2020-02-15 DIAGNOSIS — M545 Low back pain, unspecified: Secondary | ICD-10-CM | POA: Diagnosis not present

## 2020-02-15 DIAGNOSIS — M542 Cervicalgia: Secondary | ICD-10-CM | POA: Diagnosis not present

## 2020-02-21 DIAGNOSIS — M545 Low back pain, unspecified: Secondary | ICD-10-CM | POA: Diagnosis not present

## 2020-02-21 DIAGNOSIS — M542 Cervicalgia: Secondary | ICD-10-CM | POA: Diagnosis not present

## 2020-02-24 DIAGNOSIS — M542 Cervicalgia: Secondary | ICD-10-CM | POA: Diagnosis not present

## 2020-02-24 DIAGNOSIS — M545 Low back pain, unspecified: Secondary | ICD-10-CM | POA: Diagnosis not present

## 2020-02-27 ENCOUNTER — Other Ambulatory Visit: Payer: Self-pay | Admitting: Family Medicine

## 2020-02-27 DIAGNOSIS — M545 Low back pain, unspecified: Secondary | ICD-10-CM | POA: Diagnosis not present

## 2020-02-27 DIAGNOSIS — M542 Cervicalgia: Secondary | ICD-10-CM | POA: Diagnosis not present

## 2020-02-27 DIAGNOSIS — Z1231 Encounter for screening mammogram for malignant neoplasm of breast: Secondary | ICD-10-CM

## 2020-02-28 DIAGNOSIS — F321 Major depressive disorder, single episode, moderate: Secondary | ICD-10-CM | POA: Diagnosis not present

## 2020-02-28 DIAGNOSIS — M549 Dorsalgia, unspecified: Secondary | ICD-10-CM | POA: Diagnosis not present

## 2020-02-28 DIAGNOSIS — M1990 Unspecified osteoarthritis, unspecified site: Secondary | ICD-10-CM | POA: Diagnosis not present

## 2020-02-28 DIAGNOSIS — Z0000 Encounter for general adult medical examination without abnormal findings: Secondary | ICD-10-CM | POA: Diagnosis not present

## 2020-02-28 DIAGNOSIS — E7800 Pure hypercholesterolemia, unspecified: Secondary | ICD-10-CM | POA: Diagnosis not present

## 2020-02-28 DIAGNOSIS — Z1389 Encounter for screening for other disorder: Secondary | ICD-10-CM | POA: Diagnosis not present

## 2020-03-02 ENCOUNTER — Other Ambulatory Visit: Payer: Self-pay

## 2020-03-02 ENCOUNTER — Ambulatory Visit
Admission: RE | Admit: 2020-03-02 | Discharge: 2020-03-02 | Disposition: A | Discharge status: Home / Self Care | Payer: PPO | Source: Ambulatory Visit | Attending: Family Medicine | Admitting: Family Medicine

## 2020-03-02 DIAGNOSIS — Z1231 Encounter for screening mammogram for malignant neoplasm of breast: Secondary | ICD-10-CM

## 2020-03-02 DIAGNOSIS — M545 Low back pain, unspecified: Secondary | ICD-10-CM | POA: Diagnosis not present

## 2020-03-05 DIAGNOSIS — M542 Cervicalgia: Secondary | ICD-10-CM | POA: Diagnosis not present

## 2020-03-05 DIAGNOSIS — M545 Low back pain, unspecified: Secondary | ICD-10-CM | POA: Diagnosis not present

## 2020-03-09 DIAGNOSIS — M545 Low back pain, unspecified: Secondary | ICD-10-CM | POA: Diagnosis not present

## 2020-03-09 DIAGNOSIS — M542 Cervicalgia: Secondary | ICD-10-CM | POA: Diagnosis not present

## 2020-03-12 DIAGNOSIS — M545 Low back pain, unspecified: Secondary | ICD-10-CM | POA: Diagnosis not present

## 2020-03-13 DIAGNOSIS — M13821 Other specified arthritis, right elbow: Secondary | ICD-10-CM | POA: Diagnosis not present

## 2020-03-13 DIAGNOSIS — M13822 Other specified arthritis, left elbow: Secondary | ICD-10-CM | POA: Diagnosis not present

## 2020-03-15 DIAGNOSIS — M545 Low back pain, unspecified: Secondary | ICD-10-CM | POA: Diagnosis not present

## 2020-03-15 DIAGNOSIS — M542 Cervicalgia: Secondary | ICD-10-CM | POA: Diagnosis not present

## 2020-03-19 DIAGNOSIS — M545 Low back pain, unspecified: Secondary | ICD-10-CM | POA: Diagnosis not present

## 2020-03-22 DIAGNOSIS — M545 Low back pain, unspecified: Secondary | ICD-10-CM | POA: Diagnosis not present

## 2020-03-26 DIAGNOSIS — M545 Low back pain, unspecified: Secondary | ICD-10-CM | POA: Diagnosis not present

## 2020-03-30 DIAGNOSIS — M545 Low back pain, unspecified: Secondary | ICD-10-CM | POA: Diagnosis not present

## 2020-03-30 DIAGNOSIS — M542 Cervicalgia: Secondary | ICD-10-CM | POA: Diagnosis not present

## 2020-04-02 DIAGNOSIS — M545 Low back pain, unspecified: Secondary | ICD-10-CM | POA: Diagnosis not present

## 2020-04-06 DIAGNOSIS — M545 Low back pain, unspecified: Secondary | ICD-10-CM | POA: Diagnosis not present

## 2020-04-13 DIAGNOSIS — M545 Low back pain, unspecified: Secondary | ICD-10-CM | POA: Diagnosis not present

## 2020-04-16 DIAGNOSIS — M545 Low back pain, unspecified: Secondary | ICD-10-CM | POA: Diagnosis not present

## 2020-04-20 DIAGNOSIS — M545 Low back pain, unspecified: Secondary | ICD-10-CM | POA: Diagnosis not present

## 2020-04-20 DIAGNOSIS — M542 Cervicalgia: Secondary | ICD-10-CM | POA: Diagnosis not present

## 2020-04-23 DIAGNOSIS — M545 Low back pain, unspecified: Secondary | ICD-10-CM | POA: Diagnosis not present

## 2020-04-27 DIAGNOSIS — M545 Low back pain, unspecified: Secondary | ICD-10-CM | POA: Diagnosis not present

## 2020-04-30 DIAGNOSIS — M542 Cervicalgia: Secondary | ICD-10-CM | POA: Diagnosis not present

## 2020-04-30 DIAGNOSIS — M545 Low back pain, unspecified: Secondary | ICD-10-CM | POA: Diagnosis not present

## 2020-05-04 DIAGNOSIS — M545 Low back pain, unspecified: Secondary | ICD-10-CM | POA: Diagnosis not present

## 2020-05-07 DIAGNOSIS — M545 Low back pain, unspecified: Secondary | ICD-10-CM | POA: Diagnosis not present

## 2020-05-07 DIAGNOSIS — M542 Cervicalgia: Secondary | ICD-10-CM | POA: Diagnosis not present

## 2020-05-10 DIAGNOSIS — M4180 Other forms of scoliosis, site unspecified: Secondary | ICD-10-CM | POA: Diagnosis not present

## 2020-05-10 DIAGNOSIS — M419 Scoliosis, unspecified: Secondary | ICD-10-CM | POA: Diagnosis not present

## 2020-05-11 DIAGNOSIS — M545 Low back pain, unspecified: Secondary | ICD-10-CM | POA: Diagnosis not present

## 2020-05-14 DIAGNOSIS — M545 Low back pain, unspecified: Secondary | ICD-10-CM | POA: Diagnosis not present

## 2020-05-14 DIAGNOSIS — M542 Cervicalgia: Secondary | ICD-10-CM | POA: Diagnosis not present

## 2020-05-18 DIAGNOSIS — M545 Low back pain, unspecified: Secondary | ICD-10-CM | POA: Diagnosis not present

## 2020-05-21 DIAGNOSIS — M545 Low back pain, unspecified: Secondary | ICD-10-CM | POA: Diagnosis not present

## 2020-05-21 DIAGNOSIS — M542 Cervicalgia: Secondary | ICD-10-CM | POA: Diagnosis not present

## 2020-05-25 DIAGNOSIS — M545 Low back pain, unspecified: Secondary | ICD-10-CM | POA: Diagnosis not present

## 2020-05-29 DIAGNOSIS — G894 Chronic pain syndrome: Secondary | ICD-10-CM | POA: Diagnosis not present

## 2020-06-04 DIAGNOSIS — M545 Low back pain, unspecified: Secondary | ICD-10-CM | POA: Diagnosis not present

## 2020-06-08 DIAGNOSIS — M542 Cervicalgia: Secondary | ICD-10-CM | POA: Diagnosis not present

## 2020-06-08 DIAGNOSIS — M545 Low back pain, unspecified: Secondary | ICD-10-CM | POA: Diagnosis not present

## 2020-06-15 DIAGNOSIS — M545 Low back pain, unspecified: Secondary | ICD-10-CM | POA: Diagnosis not present

## 2020-06-20 DIAGNOSIS — L237 Allergic contact dermatitis due to plants, except food: Secondary | ICD-10-CM | POA: Diagnosis not present

## 2020-06-22 DIAGNOSIS — M545 Low back pain, unspecified: Secondary | ICD-10-CM | POA: Diagnosis not present

## 2020-06-29 DIAGNOSIS — M545 Low back pain, unspecified: Secondary | ICD-10-CM | POA: Diagnosis not present

## 2020-06-29 DIAGNOSIS — M542 Cervicalgia: Secondary | ICD-10-CM | POA: Diagnosis not present

## 2020-07-09 DIAGNOSIS — Z03818 Encounter for observation for suspected exposure to other biological agents ruled out: Secondary | ICD-10-CM | POA: Diagnosis not present

## 2020-07-09 DIAGNOSIS — J069 Acute upper respiratory infection, unspecified: Secondary | ICD-10-CM | POA: Diagnosis not present

## 2020-07-09 DIAGNOSIS — I10 Essential (primary) hypertension: Secondary | ICD-10-CM | POA: Diagnosis not present

## 2020-07-09 DIAGNOSIS — R0602 Shortness of breath: Secondary | ICD-10-CM | POA: Diagnosis not present

## 2020-07-13 DIAGNOSIS — M545 Low back pain, unspecified: Secondary | ICD-10-CM | POA: Diagnosis not present

## 2020-07-20 DIAGNOSIS — M542 Cervicalgia: Secondary | ICD-10-CM | POA: Diagnosis not present

## 2020-07-20 DIAGNOSIS — M545 Low back pain, unspecified: Secondary | ICD-10-CM | POA: Diagnosis not present

## 2020-07-25 DIAGNOSIS — M542 Cervicalgia: Secondary | ICD-10-CM | POA: Diagnosis not present

## 2020-07-25 DIAGNOSIS — M545 Low back pain, unspecified: Secondary | ICD-10-CM | POA: Diagnosis not present

## 2020-08-21 DIAGNOSIS — T148XXA Other injury of unspecified body region, initial encounter: Secondary | ICD-10-CM | POA: Diagnosis not present

## 2020-08-21 DIAGNOSIS — S80221A Blister (nonthermal), right knee, initial encounter: Secondary | ICD-10-CM | POA: Diagnosis not present

## 2020-08-21 DIAGNOSIS — I10 Essential (primary) hypertension: Secondary | ICD-10-CM | POA: Diagnosis not present

## 2020-11-08 DIAGNOSIS — M549 Dorsalgia, unspecified: Secondary | ICD-10-CM | POA: Diagnosis not present

## 2020-11-08 DIAGNOSIS — Z79899 Other long term (current) drug therapy: Secondary | ICD-10-CM | POA: Diagnosis not present

## 2020-11-30 DIAGNOSIS — M545 Low back pain, unspecified: Secondary | ICD-10-CM | POA: Diagnosis not present

## 2020-11-30 DIAGNOSIS — M546 Pain in thoracic spine: Secondary | ICD-10-CM | POA: Diagnosis not present

## 2020-12-04 DIAGNOSIS — M546 Pain in thoracic spine: Secondary | ICD-10-CM | POA: Diagnosis not present

## 2020-12-04 DIAGNOSIS — Z79899 Other long term (current) drug therapy: Secondary | ICD-10-CM | POA: Diagnosis not present

## 2020-12-04 DIAGNOSIS — M545 Low back pain, unspecified: Secondary | ICD-10-CM | POA: Diagnosis not present

## 2020-12-04 DIAGNOSIS — Z5181 Encounter for therapeutic drug level monitoring: Secondary | ICD-10-CM | POA: Diagnosis not present

## 2020-12-07 DIAGNOSIS — M546 Pain in thoracic spine: Secondary | ICD-10-CM | POA: Diagnosis not present

## 2020-12-07 DIAGNOSIS — M545 Low back pain, unspecified: Secondary | ICD-10-CM | POA: Diagnosis not present

## 2020-12-11 DIAGNOSIS — M546 Pain in thoracic spine: Secondary | ICD-10-CM | POA: Diagnosis not present

## 2020-12-11 DIAGNOSIS — M545 Low back pain, unspecified: Secondary | ICD-10-CM | POA: Diagnosis not present

## 2020-12-14 DIAGNOSIS — M546 Pain in thoracic spine: Secondary | ICD-10-CM | POA: Diagnosis not present

## 2020-12-14 DIAGNOSIS — M545 Low back pain, unspecified: Secondary | ICD-10-CM | POA: Diagnosis not present

## 2020-12-20 DIAGNOSIS — M545 Low back pain, unspecified: Secondary | ICD-10-CM | POA: Diagnosis not present

## 2020-12-20 DIAGNOSIS — M546 Pain in thoracic spine: Secondary | ICD-10-CM | POA: Diagnosis not present

## 2020-12-25 DIAGNOSIS — M546 Pain in thoracic spine: Secondary | ICD-10-CM | POA: Diagnosis not present

## 2020-12-25 DIAGNOSIS — M545 Low back pain, unspecified: Secondary | ICD-10-CM | POA: Diagnosis not present

## 2021-01-01 DIAGNOSIS — M545 Low back pain, unspecified: Secondary | ICD-10-CM | POA: Diagnosis not present

## 2021-01-01 DIAGNOSIS — M546 Pain in thoracic spine: Secondary | ICD-10-CM | POA: Diagnosis not present

## 2021-01-03 DIAGNOSIS — M546 Pain in thoracic spine: Secondary | ICD-10-CM | POA: Diagnosis not present

## 2021-01-03 DIAGNOSIS — M545 Low back pain, unspecified: Secondary | ICD-10-CM | POA: Diagnosis not present

## 2021-01-11 DIAGNOSIS — M13822 Other specified arthritis, left elbow: Secondary | ICD-10-CM | POA: Diagnosis not present

## 2021-01-11 DIAGNOSIS — M13821 Other specified arthritis, right elbow: Secondary | ICD-10-CM | POA: Diagnosis not present

## 2021-01-14 DIAGNOSIS — M545 Low back pain, unspecified: Secondary | ICD-10-CM | POA: Diagnosis not present

## 2021-01-14 DIAGNOSIS — M546 Pain in thoracic spine: Secondary | ICD-10-CM | POA: Diagnosis not present

## 2021-01-24 DIAGNOSIS — D485 Neoplasm of uncertain behavior of skin: Secondary | ICD-10-CM | POA: Diagnosis not present

## 2021-01-24 DIAGNOSIS — L578 Other skin changes due to chronic exposure to nonionizing radiation: Secondary | ICD-10-CM | POA: Diagnosis not present

## 2021-01-24 DIAGNOSIS — L821 Other seborrheic keratosis: Secondary | ICD-10-CM | POA: Diagnosis not present

## 2021-01-24 DIAGNOSIS — D1801 Hemangioma of skin and subcutaneous tissue: Secondary | ICD-10-CM | POA: Diagnosis not present

## 2021-01-24 DIAGNOSIS — D229 Melanocytic nevi, unspecified: Secondary | ICD-10-CM | POA: Diagnosis not present

## 2021-01-24 DIAGNOSIS — L814 Other melanin hyperpigmentation: Secondary | ICD-10-CM | POA: Diagnosis not present

## 2021-01-24 DIAGNOSIS — M546 Pain in thoracic spine: Secondary | ICD-10-CM | POA: Diagnosis not present

## 2021-01-24 DIAGNOSIS — Z23 Encounter for immunization: Secondary | ICD-10-CM | POA: Diagnosis not present

## 2021-01-24 DIAGNOSIS — M545 Low back pain, unspecified: Secondary | ICD-10-CM | POA: Diagnosis not present

## 2021-01-24 DIAGNOSIS — L820 Inflamed seborrheic keratosis: Secondary | ICD-10-CM | POA: Diagnosis not present

## 2021-01-24 DIAGNOSIS — L570 Actinic keratosis: Secondary | ICD-10-CM | POA: Diagnosis not present

## 2021-01-26 DIAGNOSIS — Z23 Encounter for immunization: Secondary | ICD-10-CM | POA: Diagnosis not present

## 2021-02-04 DIAGNOSIS — M546 Pain in thoracic spine: Secondary | ICD-10-CM | POA: Diagnosis not present

## 2021-02-04 DIAGNOSIS — M545 Low back pain, unspecified: Secondary | ICD-10-CM | POA: Diagnosis not present

## 2021-02-11 DIAGNOSIS — M546 Pain in thoracic spine: Secondary | ICD-10-CM | POA: Diagnosis not present

## 2021-02-11 DIAGNOSIS — M545 Low back pain, unspecified: Secondary | ICD-10-CM | POA: Diagnosis not present

## 2021-02-25 DIAGNOSIS — M546 Pain in thoracic spine: Secondary | ICD-10-CM | POA: Diagnosis not present

## 2021-02-25 DIAGNOSIS — M545 Low back pain, unspecified: Secondary | ICD-10-CM | POA: Diagnosis not present

## 2021-03-04 DIAGNOSIS — M546 Pain in thoracic spine: Secondary | ICD-10-CM | POA: Diagnosis not present

## 2021-03-04 DIAGNOSIS — M545 Low back pain, unspecified: Secondary | ICD-10-CM | POA: Diagnosis not present

## 2021-03-08 DIAGNOSIS — K5903 Drug induced constipation: Secondary | ICD-10-CM | POA: Diagnosis not present

## 2021-03-08 DIAGNOSIS — I10 Essential (primary) hypertension: Secondary | ICD-10-CM | POA: Diagnosis not present

## 2021-03-08 DIAGNOSIS — Z1389 Encounter for screening for other disorder: Secondary | ICD-10-CM | POA: Diagnosis not present

## 2021-03-08 DIAGNOSIS — M1990 Unspecified osteoarthritis, unspecified site: Secondary | ICD-10-CM | POA: Diagnosis not present

## 2021-03-08 DIAGNOSIS — J069 Acute upper respiratory infection, unspecified: Secondary | ICD-10-CM | POA: Diagnosis not present

## 2021-03-08 DIAGNOSIS — F324 Major depressive disorder, single episode, in partial remission: Secondary | ICD-10-CM | POA: Diagnosis not present

## 2021-03-08 DIAGNOSIS — E7800 Pure hypercholesterolemia, unspecified: Secondary | ICD-10-CM | POA: Diagnosis not present

## 2021-03-08 DIAGNOSIS — M549 Dorsalgia, unspecified: Secondary | ICD-10-CM | POA: Diagnosis not present

## 2021-03-08 DIAGNOSIS — Z0000 Encounter for general adult medical examination without abnormal findings: Secondary | ICD-10-CM | POA: Diagnosis not present

## 2021-03-12 DIAGNOSIS — B349 Viral infection, unspecified: Secondary | ICD-10-CM | POA: Diagnosis not present

## 2021-03-12 DIAGNOSIS — Z03818 Encounter for observation for suspected exposure to other biological agents ruled out: Secondary | ICD-10-CM | POA: Diagnosis not present

## 2021-03-12 DIAGNOSIS — J101 Influenza due to other identified influenza virus with other respiratory manifestations: Secondary | ICD-10-CM | POA: Diagnosis not present

## 2021-03-21 DIAGNOSIS — M546 Pain in thoracic spine: Secondary | ICD-10-CM | POA: Diagnosis not present

## 2021-03-21 DIAGNOSIS — M545 Low back pain, unspecified: Secondary | ICD-10-CM | POA: Diagnosis not present

## 2021-04-02 DIAGNOSIS — M25522 Pain in left elbow: Secondary | ICD-10-CM | POA: Diagnosis not present

## 2021-04-25 DIAGNOSIS — M7661 Achilles tendinitis, right leg: Secondary | ICD-10-CM | POA: Diagnosis not present

## 2021-04-25 DIAGNOSIS — M79671 Pain in right foot: Secondary | ICD-10-CM | POA: Diagnosis not present

## 2021-05-15 DIAGNOSIS — Z6837 Body mass index (BMI) 37.0-37.9, adult: Secondary | ICD-10-CM | POA: Diagnosis not present

## 2021-05-15 DIAGNOSIS — M419 Scoliosis, unspecified: Secondary | ICD-10-CM | POA: Diagnosis not present

## 2021-05-15 DIAGNOSIS — M40204 Unspecified kyphosis, thoracic region: Secondary | ICD-10-CM | POA: Diagnosis not present

## 2021-05-15 DIAGNOSIS — M4186 Other forms of scoliosis, lumbar region: Secondary | ICD-10-CM | POA: Diagnosis not present

## 2021-06-06 DIAGNOSIS — M7661 Achilles tendinitis, right leg: Secondary | ICD-10-CM | POA: Diagnosis not present

## 2021-06-12 IMAGING — CT CT T SPINE W/O CM
3 series · 9 of 33 positions shown, 11 images · non-contrast
Comparison: None.

CLINICAL DATA: Scoliosis of the thoracolumbar spine

EXAM:
CT THORACIC AND LUMBAR SPINE WITHOUT CONTRAST
TECHNIQUE: Multidetector CT imaging of the thoracic and lumbar spine was
performed without contrast. Multiplanar CT image reconstructions
were also generated.

[Series 4: t-spine 2.0 st · axial · 0.43mm/px · z∈[+1201,+1201]mm · 1 of 157 slices shown, 2 images]
[im 85/157  soft-tissue]
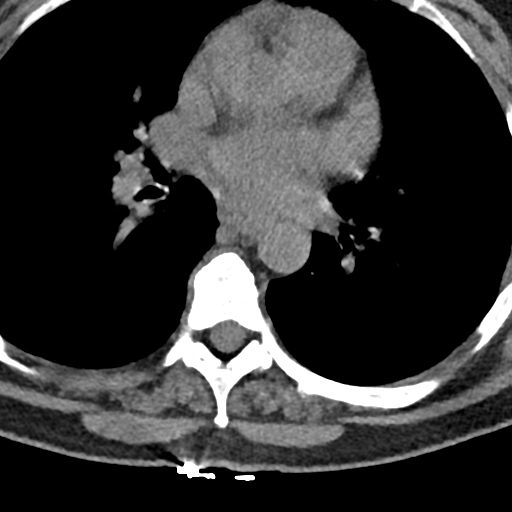
[im 85/157  bone]
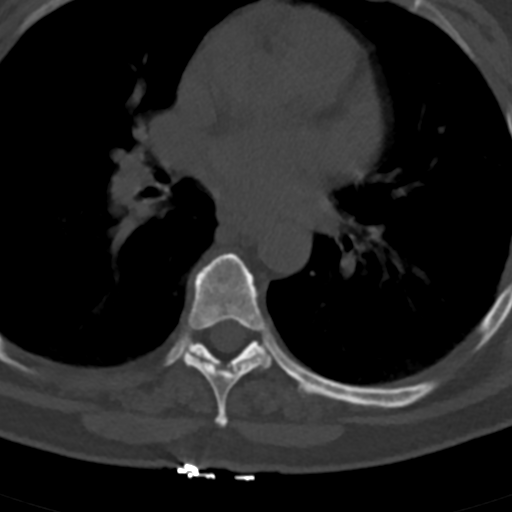

[Series 7: coronal st · coronal · 0.25mm/px · 3 of 91 slices shown]
[im 19/91  bone]
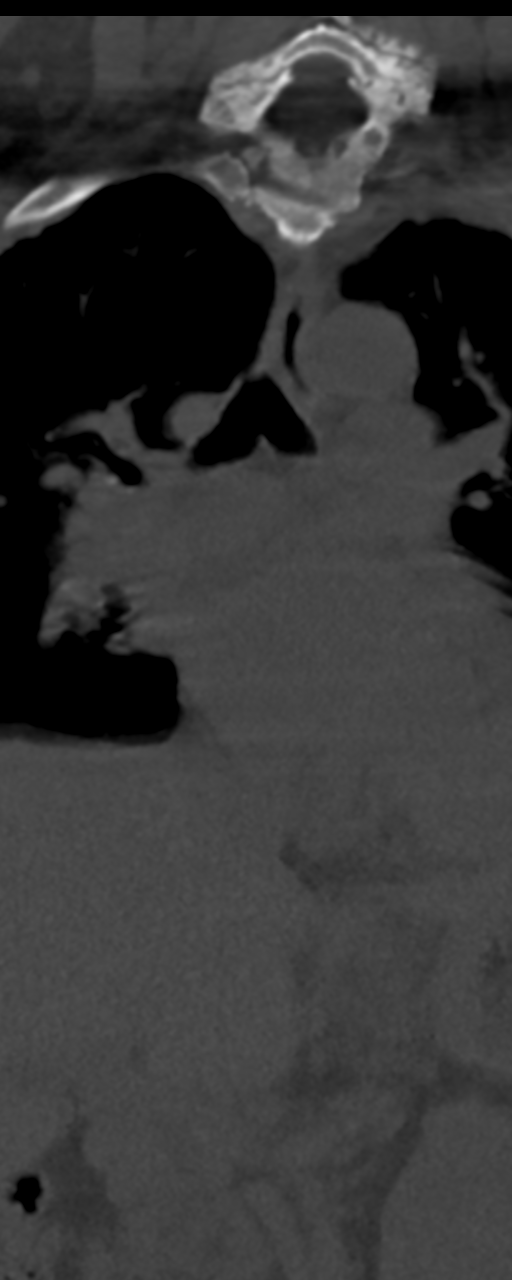
[im 37/91  bone]
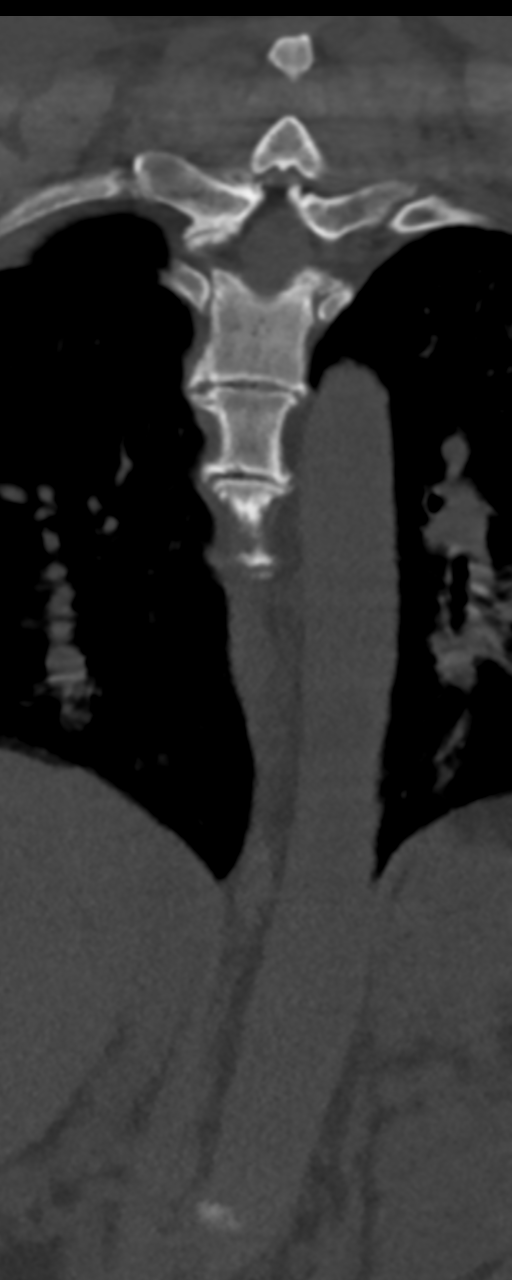
[im 55/91  bone]
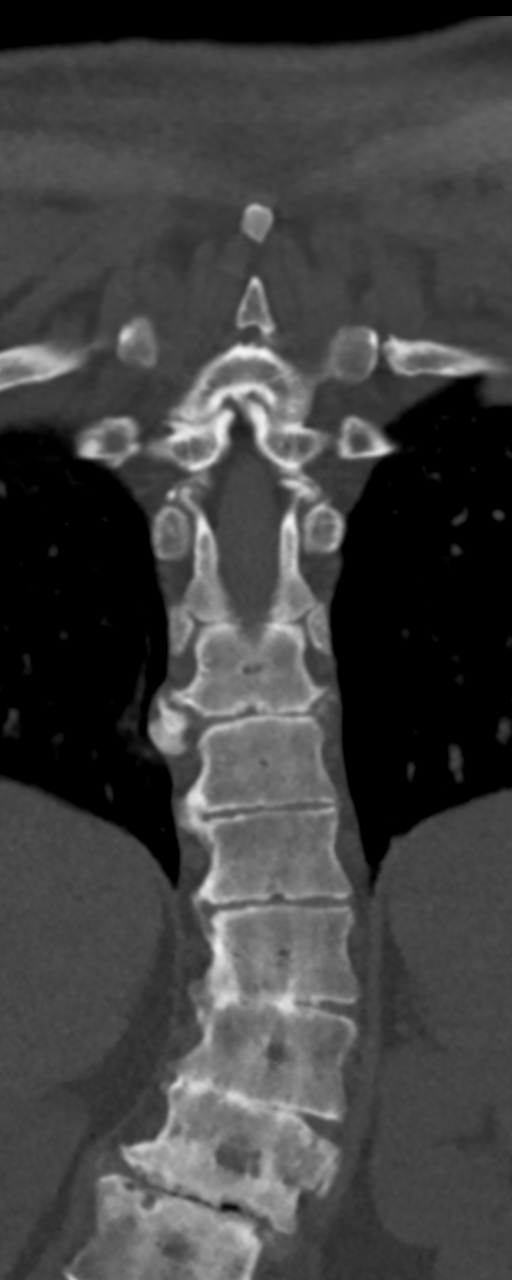

[Series 8: sagittal st · sagittal · 0.38mm/px · 5 of 61 slices shown, 6 images]
[im 21/61  bone]
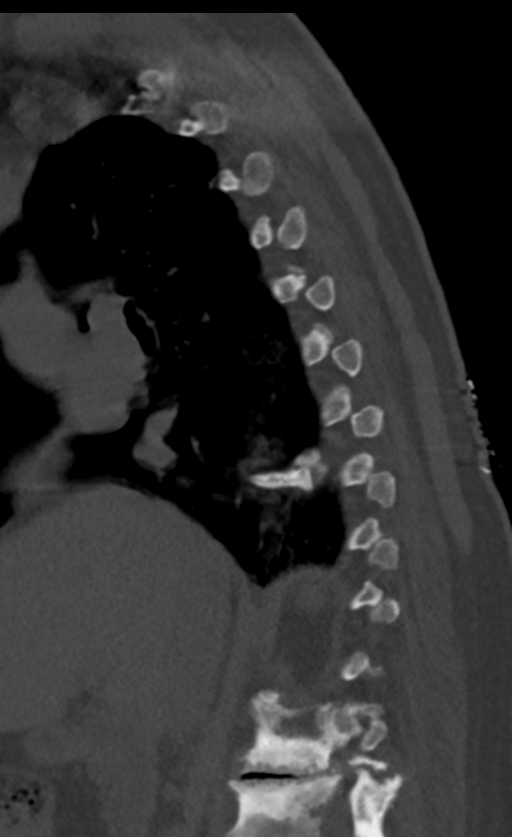
[im 26/61  bone]
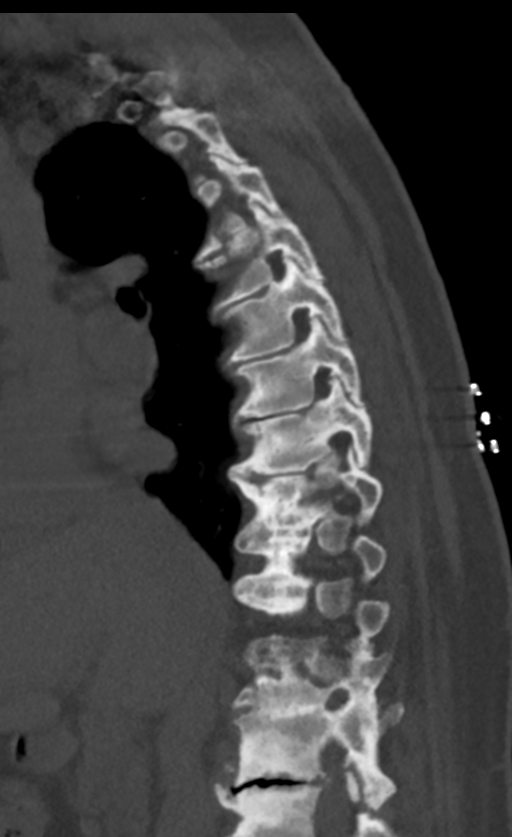
[im 31/61  soft-tissue]
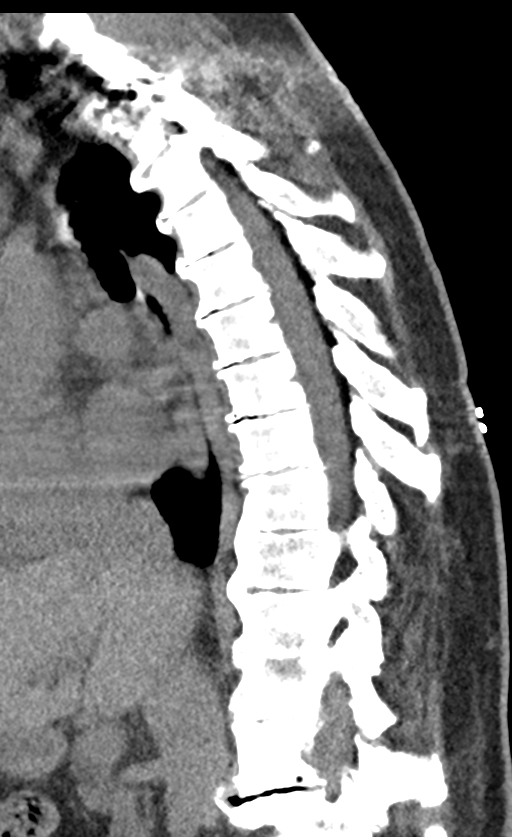
[im 31/61  bone]
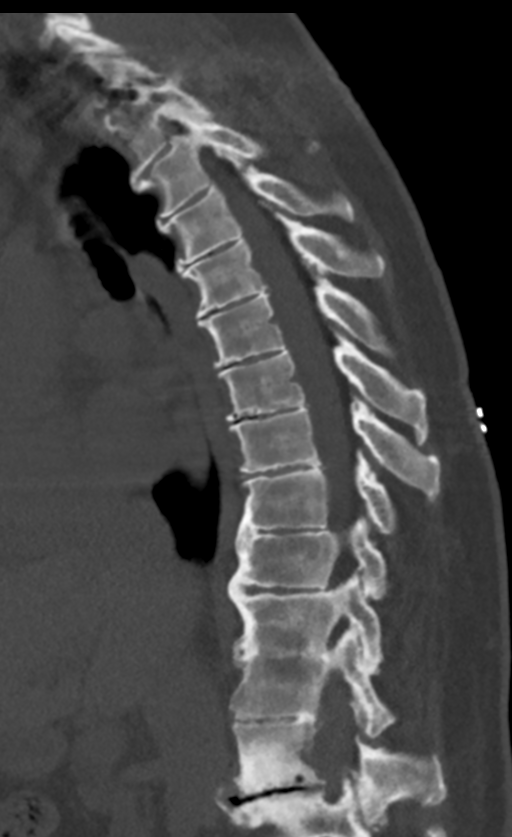
[im 36/61  bone]
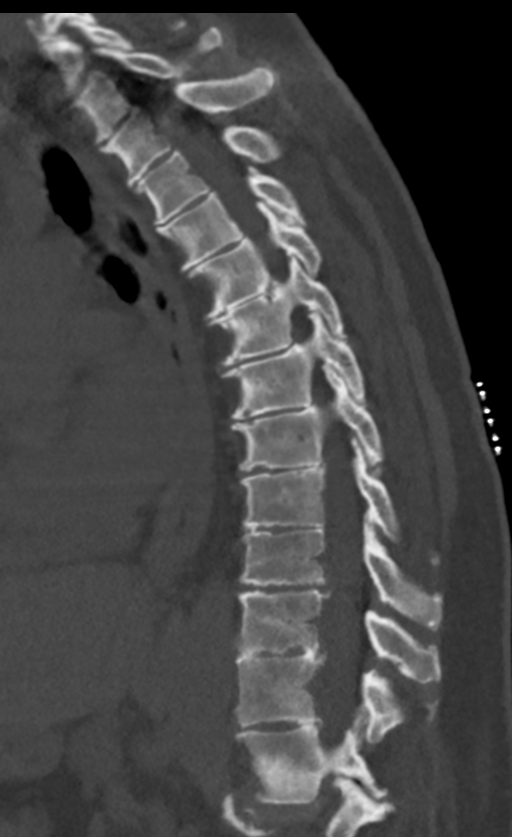
[im 41/61  bone]
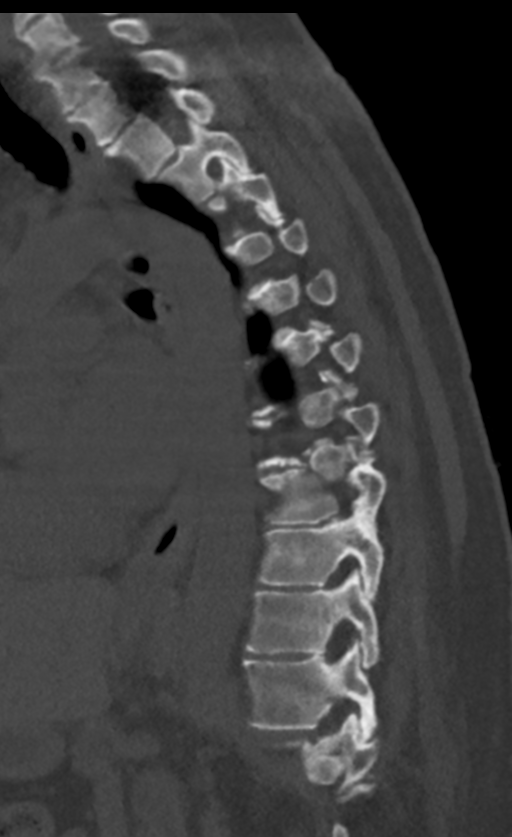

[9 of 33 positions shown; findings below may reference images not displayed]

FINDINGS: CT THORACIC SPINE FINDINGS

Alignment: No static listhesis. Dextroscoliosis of the thoracolumbar
spine.

Vertebrae: No acute fracture or focal pathologic process.

Paraspinal and other soft tissues: No acute paraspinal abnormality.

Disc levels: Degenerative disease with disc height loss throughout
the thoracic spine . severe bilateral facet arthropathy at T12-L1.
Ankylosis of the posterior elements at T11-12. Partial ankylosis
across the disc spaces at T10-11 and T11-12. Mild facet arthropathy
throughout the thoracic spine. Mild bilateral foraminal stenosis at
T7-8 and T8-9. Severe left foraminal stenosis at T10-11.

CT LUMBAR SPINE FINDINGS

Segmentation: 5 lumbar type vertebrae.

Alignment: No static listhesis. Severe dextroscoliosis of the
thoracolumbar spine.

Vertebrae: No acute fracture or focal pathologic process. Mild
osteoarthritis of bilateral SI joints.

Paraspinal and other soft tissues: No acute paraspinal abnormality.

Disc levels: Degenerative disease with severe disc height loss at
T12-L1, L4-5 and L5-S1. Ankylosis of the vertebral body and
posterior elements at L1-2, L2-3. Severe bilateral facet arthropathy
at T12-L1, L4-5 and L5-S1. Bilateral foraminal stenosis at T12-L1,
right greater than left. Left foraminal narrowing at L1-2. Right
foraminal stenosis at L5-S1.
IMPRESSION: 1.  No acute osseous injury of the thoracic spine.
2. Severe dextroscoliosis of the thoracolumbar spine.
3. Thoracolumbar spine spondylosis as described above most severe in
the lumbar spine.

## 2021-06-21 DIAGNOSIS — G894 Chronic pain syndrome: Secondary | ICD-10-CM | POA: Diagnosis not present

## 2021-07-01 DIAGNOSIS — M13822 Other specified arthritis, left elbow: Secondary | ICD-10-CM | POA: Diagnosis not present

## 2021-07-22 DIAGNOSIS — L821 Other seborrheic keratosis: Secondary | ICD-10-CM | POA: Diagnosis not present

## 2021-07-22 DIAGNOSIS — L820 Inflamed seborrheic keratosis: Secondary | ICD-10-CM | POA: Diagnosis not present

## 2021-07-22 DIAGNOSIS — D1801 Hemangioma of skin and subcutaneous tissue: Secondary | ICD-10-CM | POA: Diagnosis not present

## 2021-07-22 DIAGNOSIS — L570 Actinic keratosis: Secondary | ICD-10-CM | POA: Diagnosis not present

## 2021-07-22 DIAGNOSIS — L578 Other skin changes due to chronic exposure to nonionizing radiation: Secondary | ICD-10-CM | POA: Diagnosis not present

## 2021-07-22 DIAGNOSIS — D229 Melanocytic nevi, unspecified: Secondary | ICD-10-CM | POA: Diagnosis not present

## 2021-07-22 DIAGNOSIS — D239 Other benign neoplasm of skin, unspecified: Secondary | ICD-10-CM | POA: Diagnosis not present

## 2021-07-22 DIAGNOSIS — L814 Other melanin hyperpigmentation: Secondary | ICD-10-CM | POA: Diagnosis not present

## 2021-09-27 DIAGNOSIS — M25522 Pain in left elbow: Secondary | ICD-10-CM | POA: Diagnosis not present

## 2021-10-21 DIAGNOSIS — G894 Chronic pain syndrome: Secondary | ICD-10-CM | POA: Diagnosis not present

## 2021-10-21 DIAGNOSIS — M47816 Spondylosis without myelopathy or radiculopathy, lumbar region: Secondary | ICD-10-CM | POA: Diagnosis not present

## 2021-10-21 DIAGNOSIS — Z79891 Long term (current) use of opiate analgesic: Secondary | ICD-10-CM | POA: Diagnosis not present

## 2022-01-21 DIAGNOSIS — M25522 Pain in left elbow: Secondary | ICD-10-CM | POA: Diagnosis not present

## 2022-01-23 DIAGNOSIS — L814 Other melanin hyperpigmentation: Secondary | ICD-10-CM | POA: Diagnosis not present

## 2022-01-23 DIAGNOSIS — R202 Paresthesia of skin: Secondary | ICD-10-CM | POA: Diagnosis not present

## 2022-01-23 DIAGNOSIS — D229 Melanocytic nevi, unspecified: Secondary | ICD-10-CM | POA: Diagnosis not present

## 2022-01-23 DIAGNOSIS — L821 Other seborrheic keratosis: Secondary | ICD-10-CM | POA: Diagnosis not present

## 2022-01-23 DIAGNOSIS — L719 Rosacea, unspecified: Secondary | ICD-10-CM | POA: Diagnosis not present

## 2022-01-23 DIAGNOSIS — D239 Other benign neoplasm of skin, unspecified: Secondary | ICD-10-CM | POA: Diagnosis not present

## 2022-01-23 DIAGNOSIS — L578 Other skin changes due to chronic exposure to nonionizing radiation: Secondary | ICD-10-CM | POA: Diagnosis not present

## 2022-02-12 ENCOUNTER — Other Ambulatory Visit: Payer: Self-pay | Admitting: Internal Medicine

## 2022-02-12 DIAGNOSIS — Z1231 Encounter for screening mammogram for malignant neoplasm of breast: Secondary | ICD-10-CM

## 2022-02-17 ENCOUNTER — Ambulatory Visit
Admission: RE | Admit: 2022-02-17 | Discharge: 2022-02-17 | Disposition: A | Discharge status: Home / Self Care | Payer: PPO | Source: Ambulatory Visit | Attending: Internal Medicine | Admitting: Internal Medicine

## 2022-02-17 DIAGNOSIS — Z1231 Encounter for screening mammogram for malignant neoplasm of breast: Secondary | ICD-10-CM

## 2022-02-20 DIAGNOSIS — M47812 Spondylosis without myelopathy or radiculopathy, cervical region: Secondary | ICD-10-CM | POA: Diagnosis not present

## 2022-02-20 DIAGNOSIS — Z5181 Encounter for therapeutic drug level monitoring: Secondary | ICD-10-CM | POA: Diagnosis not present

## 2022-02-20 DIAGNOSIS — G894 Chronic pain syndrome: Secondary | ICD-10-CM | POA: Diagnosis not present

## 2022-02-20 DIAGNOSIS — Z79899 Other long term (current) drug therapy: Secondary | ICD-10-CM | POA: Diagnosis not present

## 2022-02-20 DIAGNOSIS — Z79891 Long term (current) use of opiate analgesic: Secondary | ICD-10-CM | POA: Diagnosis not present

## 2022-02-20 DIAGNOSIS — M545 Low back pain, unspecified: Secondary | ICD-10-CM | POA: Diagnosis not present

## 2022-03-12 DIAGNOSIS — M549 Dorsalgia, unspecified: Secondary | ICD-10-CM | POA: Diagnosis not present

## 2022-03-12 DIAGNOSIS — Z0000 Encounter for general adult medical examination without abnormal findings: Secondary | ICD-10-CM | POA: Diagnosis not present

## 2022-03-12 DIAGNOSIS — Z981 Arthrodesis status: Secondary | ICD-10-CM | POA: Diagnosis not present

## 2022-03-12 DIAGNOSIS — R296 Repeated falls: Secondary | ICD-10-CM | POA: Diagnosis not present

## 2022-03-12 DIAGNOSIS — F324 Major depressive disorder, single episode, in partial remission: Secondary | ICD-10-CM | POA: Diagnosis not present

## 2022-03-12 DIAGNOSIS — Z23 Encounter for immunization: Secondary | ICD-10-CM | POA: Diagnosis not present

## 2022-03-12 DIAGNOSIS — E7800 Pure hypercholesterolemia, unspecified: Secondary | ICD-10-CM | POA: Diagnosis not present

## 2022-03-12 DIAGNOSIS — M19022 Primary osteoarthritis, left elbow: Secondary | ICD-10-CM | POA: Diagnosis not present

## 2022-03-12 DIAGNOSIS — K5903 Drug induced constipation: Secondary | ICD-10-CM | POA: Diagnosis not present

## 2022-03-12 DIAGNOSIS — I10 Essential (primary) hypertension: Secondary | ICD-10-CM | POA: Diagnosis not present

## 2022-03-12 DIAGNOSIS — M1990 Unspecified osteoarthritis, unspecified site: Secondary | ICD-10-CM | POA: Diagnosis not present

## 2022-04-11 DIAGNOSIS — M25522 Pain in left elbow: Secondary | ICD-10-CM | POA: Diagnosis not present

## 2023-02-26 ENCOUNTER — Other Ambulatory Visit: Payer: Self-pay | Admitting: Internal Medicine

## 2023-02-26 DIAGNOSIS — Z1231 Encounter for screening mammogram for malignant neoplasm of breast: Secondary | ICD-10-CM

## 2023-03-02 ENCOUNTER — Ambulatory Visit
Admission: RE | Admit: 2023-03-02 | Discharge: 2023-03-02 | Disposition: A | Discharge status: Home / Self Care | Payer: PPO | Source: Ambulatory Visit | Attending: Internal Medicine | Admitting: Internal Medicine

## 2023-03-02 DIAGNOSIS — Z1231 Encounter for screening mammogram for malignant neoplasm of breast: Secondary | ICD-10-CM

## 2023-07-29 ENCOUNTER — Other Ambulatory Visit: Payer: Self-pay | Admitting: Orthopedic Surgery

## 2023-07-29 ENCOUNTER — Other Ambulatory Visit (HOSPITAL_COMMUNITY): Payer: Self-pay | Admitting: Orthopedic Surgery

## 2023-07-29 DIAGNOSIS — Z96611 Presence of right artificial shoulder joint: Secondary | ICD-10-CM

## 2023-08-03 ENCOUNTER — Ambulatory Visit
Admission: RE | Admit: 2023-08-03 | Discharge: 2023-08-03 | Disposition: A | Discharge status: Home / Self Care | Source: Ambulatory Visit | Attending: Orthopedic Surgery | Admitting: Orthopedic Surgery

## 2023-08-03 DIAGNOSIS — Z96611 Presence of right artificial shoulder joint: Secondary | ICD-10-CM

## 2023-08-05 ENCOUNTER — Encounter (HOSPITAL_COMMUNITY)
Admission: RE | Admit: 2023-08-05 | Discharge: 2023-08-05 | Disposition: A | Discharge status: Home / Self Care | Source: Ambulatory Visit | Attending: Orthopedic Surgery | Admitting: Orthopedic Surgery

## 2023-08-05 DIAGNOSIS — Z96611 Presence of right artificial shoulder joint: Secondary | ICD-10-CM | POA: Insufficient documentation

## 2023-08-05 MED ORDER — TECHNETIUM TC 99M MEDRONATE IV KIT
20.0000 | PACK | Freq: Once | INTRAVENOUS | Status: AC | PRN
  Administered 2023-08-05: 20.7 via INTRAVENOUS

## 2023-08-07 ENCOUNTER — Encounter (HOSPITAL_COMMUNITY): Payer: Self-pay

## 2023-08-07 ENCOUNTER — Other Ambulatory Visit: Payer: Self-pay

## 2023-08-07 ENCOUNTER — Emergency Department (HOSPITAL_COMMUNITY)
Admission: EM | Admit: 2023-08-07 | Discharge: 2023-08-07 | Disposition: A | Discharge status: Home / Self Care | Attending: Emergency Medicine | Admitting: Emergency Medicine

## 2023-08-07 ENCOUNTER — Emergency Department (HOSPITAL_COMMUNITY)

## 2023-08-07 DIAGNOSIS — D649 Anemia, unspecified: Secondary | ICD-10-CM | POA: Diagnosis not present

## 2023-08-07 DIAGNOSIS — T50901A Poisoning by unspecified drugs, medicaments and biological substances, accidental (unintentional), initial encounter: Secondary | ICD-10-CM | POA: Insufficient documentation

## 2023-08-07 DIAGNOSIS — R739 Hyperglycemia, unspecified: Secondary | ICD-10-CM | POA: Insufficient documentation

## 2023-08-07 DIAGNOSIS — R55 Syncope and collapse: Secondary | ICD-10-CM | POA: Diagnosis not present

## 2023-08-07 DIAGNOSIS — Z79899 Other long term (current) drug therapy: Secondary | ICD-10-CM | POA: Diagnosis not present

## 2023-08-07 DIAGNOSIS — R42 Dizziness and giddiness: Secondary | ICD-10-CM | POA: Diagnosis present

## 2023-08-07 LAB — CBC WITH DIFFERENTIAL/PLATELET
Abs Immature Granulocytes: 0.01 10*3/uL (ref 0.00–0.07)
Basophils Absolute: 0.1 10*3/uL (ref 0.0–0.1)
Basophils Relative: 1 %
Eosinophils Absolute: 0.2 10*3/uL (ref 0.0–0.5)
Eosinophils Relative: 3 %
HCT: 34.7 % — ABNORMAL LOW (ref 36.0–46.0)
Hemoglobin: 10.8 g/dL — ABNORMAL LOW (ref 12.0–15.0)
Immature Granulocytes: 0 %
Lymphocytes Relative: 23 %
Lymphs Abs: 1.4 10*3/uL (ref 0.7–4.0)
MCH: 26.9 pg (ref 26.0–34.0)
MCHC: 31.1 g/dL (ref 30.0–36.0)
MCV: 86.5 fL (ref 80.0–100.0)
Monocytes Absolute: 0.4 10*3/uL (ref 0.1–1.0)
Monocytes Relative: 7 %
Neutro Abs: 4.1 10*3/uL (ref 1.7–7.7)
Neutrophils Relative %: 66 %
Platelets: 182 10*3/uL (ref 150–400)
RBC: 4.01 MIL/uL (ref 3.87–5.11)
RDW: 13 % (ref 11.5–15.5)
WBC: 6.2 10*3/uL (ref 4.0–10.5)
nRBC: 0 % (ref 0.0–0.2)

## 2023-08-07 LAB — COMPREHENSIVE METABOLIC PANEL WITH GFR
ALT: 22 U/L (ref 0–44)
AST: 30 U/L (ref 15–41)
Albumin: 3.6 g/dL (ref 3.5–5.0)
Alkaline Phosphatase: 78 U/L (ref 38–126)
Anion gap: 8 (ref 5–15)
BUN: 14 mg/dL (ref 6–20)
CO2: 30 mmol/L (ref 22–32)
Calcium: 8.9 mg/dL (ref 8.9–10.3)
Chloride: 99 mmol/L (ref 98–111)
Creatinine, Ser: 0.82 mg/dL (ref 0.44–1.00)
GFR, Estimated: >60 mL/min (ref >60)
Glucose, Bld: 112 mg/dL — ABNORMAL HIGH (ref 70–99)
Potassium: 4.1 mmol/L (ref 3.5–5.1)
Sodium: 137 mmol/L (ref 135–145)
Total Bilirubin: 0.4 mg/dL (ref 0.0–1.2)
Total Protein: 6.1 g/dL — ABNORMAL LOW (ref 6.5–8.1)

## 2023-08-07 LAB — CBG MONITORING, ED: Glucose-Capillary: 109 mg/dL — ABNORMAL HIGH (ref 70–99)

## 2023-08-07 MED ORDER — SODIUM CHLORIDE 0.9 % IV BOLUS
1000.0000 mL | Freq: Once | INTRAVENOUS | Status: AC
  Administered 2023-08-07: 1000 mL via INTRAVENOUS

## 2023-08-07 NOTE — ED Triage Notes (Signed)
 Pt BIB [facility] from [facility] where she was at for a regular scheduled appointment for PT. It is reported after doing squats she felt "unwell" & began feeling dizzy with a near syncopal episode. No LOC or falls, just reports generalized weakness & dizziness that does get better when she closes her eyes. A/Ox4, no slurred speech or unilateral weakness noted by EMS. On scene she was 88% O2 on RA & after 2L n/c O2 applied she was 95%, denies any SOB. Did receive 100cc NS via 18g Lt AC PIV.

## 2023-08-07 NOTE — ED Provider Notes (Signed)
 [facility] AT [facility] Provider Note   CSN: [id number] Arrival date & time: 08/07/23  8295     History  Chief Complaint  Patient presents with   Dizziness    Carmen Strickland is a 58 y.o. female with PMHx anxiety, OA, chronic back pain, depression, who presents to ED for pre-syncopal episode while at PT earlier today. Patient was standing when she felt lightheaded and was able to sit down before losing consciousness. Patient also felt a little nauseated during this episode as well. Denies other prodromal symptoms such as chest pain, SOB, abdominal pain. Endorsed a headache earlier today which is already resolving. Patient denies LOC, seizure, head trauma, or blood thinners.  Of note, patient stating that she feels dry. Patient also states that she has not been able to sleep much in the past 3 days d/t her chronic shoulder pain which she is following with outpatient ortho for. Patient also stating that she took an extra 10mg  oxy earlier today on top of her fentanyl  patch on her left hip. Patient's 25mcg fentanyl  patch was applied yesterday and she took a total of three 10mg  oxycodone 's this morning around 7AM.     Dizziness      Home Medications Prior to Admission medications   Medication Sig Start Date End Date Taking? Authorizing Provider  acetaminophen  (TYLENOL ) 500 MG tablet Take 1,000 mg by mouth every 6 (six) hours as needed for mild pain or headache.    [provider]  Ascorbic Acid (VITAMIN C) 1000 MG tablet Take 2,000 mg by mouth daily.     [provider]  Cholecalciferol (VITAMIN D) 125 MCG (5000 UT) CAPS Take 5,000 Units by mouth daily.    [provider]  diclofenac (VOLTAREN) 75 MG EC tablet Take 75 mg by mouth 2 (two) times daily.    [provider]  DULoxetine (CYMBALTA) 30 MG capsule Take 30 mg by mouth daily.    [provider]  fentaNYL  (DURAGESIC ) 100 MCG/HR Place 1 patch onto the skin  every 3 (three) days.    [provider]  magnesium oxide (MAG-OX) 400 MG tablet Take 400 mg by mouth daily.    [provider]  metaxalone (SKELAXIN) 800 MG tablet Take 800 mg by mouth 3 (three) times daily as needed for muscle spasms.    [provider]  Multiple Vitamin (MULTIVITAMIN WITH MINERALS) TABS tablet Take 1 tablet by mouth daily.    [provider]  Oxycodone  HCl 10 MG TABS Take 10 mg by mouth 4 (four) times daily as needed (pain).    [provider]      Allergies    Other    Review of Systems   Review of Systems  Neurological:  Positive for dizziness.    Physical Exam Updated Vital Signs BP 119/73   Pulse 65   Temp (!) 97.3 F (36.3 C) (Oral)   Resp 13   Ht 5' (1.524 m)   Wt 99.8 kg   LMP  (LMP Unknown)   SpO2 100%   BMI 42.97 kg/m  Physical Exam Vitals and nursing note reviewed.  Constitutional:      General: She is not in acute distress.    Appearance: She is not ill-appearing or toxic-appearing.  HENT:     Head: Normocephalic and atraumatic.     Mouth/Throat:     Mouth: Mucous membranes are moist.  Eyes:     General: No scleral icterus.  Right eye: No discharge.        Left eye: No discharge.     Conjunctiva/sclera: Conjunctivae normal.  Cardiovascular:     Rate and Rhythm: Normal rate and regular rhythm.     Pulses: Normal pulses.     Heart sounds: Normal heart sounds. No murmur heard. Pulmonary:     Effort: Pulmonary effort is normal. No respiratory distress.     Breath sounds: Normal breath sounds. No wheezing, rhonchi or rales.  Abdominal:     General: Abdomen is flat. Bowel sounds are normal. There is no distension.     Palpations: Abdomen is soft. There is no mass.     Tenderness: There is no abdominal tenderness.  Musculoskeletal:     Right lower leg: No edema.     Left lower leg: No edema.  Skin:    General: Skin is warm and dry.     Findings: No rash.  Neurological:     General: No  focal deficit present.     Mental Status: She is alert and oriented to person, place, and time. Mental status is at baseline.     Comments: GCS 15. Speech is goal oriented. No deficits appreciated to CN III-XII; symmetric eyebrow raise, no facial drooping, tongue midline. Patient has equal grip strength bilaterally with 5/5 strength against resistance in all major muscle groups bilaterally. Sensation to light touch intact. Patient moves extremities without ataxia.    Psychiatric:        Mood and Affect: Mood normal.     Comments: Patient appears slightly somnolent/drowsy. Eye's blinking very slowly.      ED Results / Procedures / Treatments   Labs (all labs ordered are listed, but only abnormal results are displayed) Labs Reviewed  CBC WITH DIFFERENTIAL/PLATELET - Abnormal; Notable for the following components:      Result Value   Hemoglobin 10.8 (*)    HCT 34.7 (*)    All other components within normal limits  COMPREHENSIVE METABOLIC PANEL WITH GFR - Abnormal; Notable for the following components:   Glucose, Bld 112 (*)    Total Protein 6.1 (*)    All other components within normal limits  CBG MONITORING, ED - Abnormal; Notable for the following components:   Glucose-Capillary 109 (*)    All other components within normal limits    EKG None  Radiology DG Chest 2 View Result Date: 08/07/2023 CLINICAL DATA:  Hypoxia. EXAM: CHEST - 2 VIEW COMPARISON:  None Available. FINDINGS: The heart size and mediastinal contours are within normal limits. Both lungs are clear. Thoracolumbar spine fusion hardware and bilateral shoulder prostheses noted. IMPRESSION: No active cardiopulmonary disease. Electronically Signed   By: Marlyce Sine M.D.   On: 08/07/2023 10:57    Procedures Procedures    Medications Ordered in ED Medications  sodium chloride  0.9 % bolus 1,000 mL (0 mLs Intravenous Stopped 08/07/23 1355)    ED Course/ Medical Decision Making/ A&P                                  Medical Decision Making Amount and/or Complexity of Data Reviewed Labs: ordered. Radiology: ordered.   This patient presents to the ED for concern of syncope, this involves an extensive number of treatment options, and is a complaint that carries with it a high risk of complications and morbidity.  The differential diagnosis includes CVA, ICH, intracranial mass, critical dehydration, endocrine abnormality, sepsis/infection,  electrolyte abnormality, cardiac arrhythmia.   Co morbidities that complicate the patient evaluation  anxiety, OA, chronic back pain, depression   Additional history obtained:  Dr. Lilyan Remedies PCP   Problem List / ED Course / Critical interventions / Medication management  Patient presents ED concern for presyncopal episode while at physical therapy.  She endorsed lightheadedness but denied any concerning prodromal symptoms.  Initial physical exam was concerning for hypoxia in the upper 80s and slow respirations. Patient endorses taking an extra oxy 10mg  this morning and applying her new fentanyl  patch yesterday.  No other recent infectious symptoms.  Rest of physical exam reassuring.  Patient afebrile with stable vitals. I Ordered, and personally interpreted labs.  CBC without leukocytosis.  There is mild anemia with hemoglobin at 10.8.  CBG 109.  CMP with slight hyperglycemia at 112. I ordered imaging studies including chest x-ray. I independently visualized and interpreted imaging which showed no active cardiopulmonary disease. I agree with the radiologist interpretation. The patient was maintained on a cardiac monitor.  I personally viewed and interpreted the EKG/cardiac monitored which showed an underlying rhythm of: Sinus rhythm Provided patient with 1L IV fluids.  Allowed patient 3 hours to metabolize her narcotics.  Reevaluated patient, and she is feeling a lot better.  Patient ready to go home.  Educated patient that it seems that her combination of lack of sleep  along with mild narcotic overdose caused her symptoms today. Patient agrees and will be more careful with dosing in the future. Patient now satting 100% on RA.  I recommended following up with PCP. I have reviewed the patients home medicines and have made adjustments as needed The patient has been appropriately medically screened and/or stabilized in the ED. I have low suspicion for any other emergent medical condition which would require further screening, evaluation or treatment in the ED or require inpatient management. At time of discharge the patient is hemodynamically stable and in no acute distress. I have discussed work-up results and diagnosis with patient and answered all questions. Patient is agreeable with discharge plan. We discussed strict return precautions for returning to the emergency department and they verbalized understanding.     Social Determinants of Health:  none          Final Clinical Impression(s) / ED Diagnoses Final diagnoses:  Pre-syncope  Accidental overdose, initial encounter    Rx / DC Orders ED Discharge Orders     None         [place], [place] 08/07/23 1357    Tegeler, Marine Sia, MD 08/07/23 [phone]

## 2023-08-07 NOTE — ED Notes (Signed)
 Pt reports she changed her 25 mcg Fentanyl  patch yesterday & took three 10 mg PO oxycodone 's this morning at approx. 0700 & during triage pt is nodding off.

## 2023-08-07 NOTE — Discharge Instructions (Addendum)
 I am glad you are feeling better.  Please follow-up with your primary care provider.  Seek emergency care if experiencing any new or worsening symptoms.  Alternating between 650 mg Tylenol  and 400 mg Advil : The best way to alternate taking Acetaminophen  (example Tylenol ) and Ibuprofen  (example Advil /Motrin ) is to take them 3 hours apart. For example, if you take ibuprofen  at 6 am you can then take Tylenol  at 9 am. You can continue this regimen throughout the day, making sure you do not exceed the recommended maximum dose for each drug.

## 2023-10-13 ENCOUNTER — Other Ambulatory Visit (HOSPITAL_COMMUNITY): Payer: Self-pay | Admitting: Family Medicine

## 2023-10-13 ENCOUNTER — Ambulatory Visit (HOSPITAL_COMMUNITY)
Admission: RE | Admit: 2023-10-13 | Discharge: 2023-10-13 | Disposition: A | Discharge status: Home / Self Care | Source: Ambulatory Visit | Attending: Vascular Surgery | Admitting: Vascular Surgery

## 2023-10-13 DIAGNOSIS — R600 Localized edema: Secondary | ICD-10-CM | POA: Insufficient documentation

## 2023-10-13 DIAGNOSIS — R609 Edema, unspecified: Secondary | ICD-10-CM | POA: Insufficient documentation

## 2024-01-19 ENCOUNTER — Other Ambulatory Visit: Payer: Self-pay | Admitting: Orthopaedic Surgery

## 2024-01-19 DIAGNOSIS — S93402A Sprain of unspecified ligament of left ankle, initial encounter: Secondary | ICD-10-CM

## 2024-01-22 ENCOUNTER — Encounter (HOSPITAL_COMMUNITY): Payer: Self-pay | Admitting: Orthopaedic Surgery

## 2024-01-22 ENCOUNTER — Other Ambulatory Visit (HOSPITAL_COMMUNITY): Payer: Self-pay | Admitting: Orthopaedic Surgery

## 2024-01-22 ENCOUNTER — Ambulatory Visit (HOSPITAL_COMMUNITY)
Admission: RE | Admit: 2024-01-22 | Discharge: 2024-01-22 | Disposition: A | Discharge status: Home / Self Care | Source: Ambulatory Visit | Attending: Vascular Surgery | Admitting: Vascular Surgery

## 2024-01-22 DIAGNOSIS — R52 Pain, unspecified: Secondary | ICD-10-CM | POA: Insufficient documentation

## 2024-01-22 LAB — VAS US ABI WITH/WO TBI
Left ABI: 1.27
Right ABI: 1.29

## 2024-01-26 ENCOUNTER — Ambulatory Visit
Admission: RE | Admit: 2024-01-26 | Discharge: 2024-01-26 | Disposition: A | Discharge status: Home / Self Care | Source: Ambulatory Visit | Attending: Orthopaedic Surgery | Admitting: Orthopaedic Surgery

## 2024-01-26 DIAGNOSIS — S93402A Sprain of unspecified ligament of left ankle, initial encounter: Secondary | ICD-10-CM

## 2024-02-25 NOTE — Patient Instructions (Signed)
 SURGICAL WAITING ROOM VISITATION  Patients having surgery or a procedure may have no more than 2 support people in the waiting area - these visitors may rotate.    Children under the age of 20 must have an adult with them who is not the patient.  Visitors with respiratory illnesses are discouraged from visiting and should remain at home.  If the patient needs to stay at the hospital during part of their recovery, the visitor guidelines for inpatient rooms apply. Pre-op nurse will coordinate an appropriate time for 1 support person to accompany patient in pre-op.  This support person may not rotate.    Please refer to the [facility] website for the visitor guidelines for Inpatients (after your surgery is over and you are in a regular room).       Your procedure is scheduled on: 03/10/24   Report to [facility] Main Entrance    Report to admitting at  7:05 AM   Call this number if you have problems the morning of surgery [phone]   Do not eat food :After Midnight.   After Midnight you may have the following liquids until 6:30 AM DAY OF SURGERY  Water Non-Citrus Juices (without pulp, NO RED-Apple, White grape, White cranberry) Black Coffee (NO MILK/CREAM OR CREAMERS, sugar ok)  Clear Tea (NO MILK/CREAM OR CREAMERS, sugar ok) regular and decaf                             Plain Jell-O (NO RED)                                           Fruit ices (not with fruit pulp, NO RED)                                     Popsicles (NO RED)                                                               Sports drinks like Gatorade (NO RED)                   The day of surgery:  Drink ONE (1) Pre-Surgery Clear Ensure  at 6:30 AM the morning of surgery. Drink in one sitting. Do not sip.  This drink was given to you during your hospital  pre-op appointment visit. Nothing else to drink after completing the  Pre-Surgery Clear Ensure.   .. Oral Hygiene is also important to reduce  your risk of infection.                                    Remember - BRUSH YOUR TEETH THE MORNING OF SURGERY WITH YOUR REGULAR TOOTHPASTE   Stop all vitamins and herbal supplements 7 days before surgery.   Take these medicines the morning of surgery with A SIP OF WATER: duloxetine(cymbalta), metoprolol, rosuvastatin, oxycodone  or tylenol  if needed.  You may not have any metal on your body including hair pins, jewelry, and body piercing             Do not wear make-up, lotions, powders, perfumes/cologne, or deodorant  Do not wear nail polish including gel and S&S, artificial/acrylic nails, or any other type of covering on natural nails including finger and toenails. If you have artificial nails, gel coating, etc. that needs to be removed by a nail salon please have this removed prior to surgery or surgery may need to be canceled/ delayed if the surgeon/ anesthesia feels like they are unable to be safely monitored.   Do not shave  48 hours prior to surgery.             Do not bring valuables to the hospital. [facility] IS NOT             RESPONSIBLE   FOR VALUABLES.   Contacts, glasses, dentures or bridgework may not be worn into surgery.   Bring small overnight bag day of surgery.   DO NOT BRING YOUR HOME MEDICATIONS TO THE HOSPITAL. PHARMACY WILL DISPENSE MEDICATIONS LISTED ON YOUR MEDICATION LIST TO YOU DURING YOUR ADMISSION IN THE HOSPITAL!    Patients discharged on the day of surgery will not be allowed to drive home.  Someone NEEDS to stay with you for the first 24 hours after anesthesia.   Special Instructions: Bring a copy of your healthcare power of attorney and living will documents the day of surgery if you haven't scanned them before.              Please read over the following fact sheets you were given: IF YOU HAVE QUESTIONS ABOUT YOUR PRE-OP INSTRUCTIONS PLEASE CALL [phone] [place]   If you received a COVID test during your pre-op visit  it is requested  that you wear a mask when out in public, stay away from anyone that may not be feeling well and notify your surgeon if you develop symptoms. If you test positive for Covid or have been in contact with anyone that has tested positive in the last 10 days please notify you surgeon.      Pre-operative 4 CHG Bath Instructions  DYNA-Hex 4 Chlorhexidine  Gluconate 4% Solution Antiseptic 4 fl. oz   You can play a key role in reducing the risk of infection after surgery. Your skin needs to be as free of germs as possible. You can reduce the number of germs on your skin by washing with CHG (chlorhexidine  gluconate) soap before surgery. CHG is an antiseptic soap that kills germs and continues to kill germs even after washing.   DO NOT use if you have an allergy to chlorhexidine /CHG or antibacterial soaps. If your skin becomes reddened or irritated, stop using the CHG and notify one of our RNs at   Please shower with the CHG soap starting 4 days before surgery using the following schedule:     Please keep in mind the following:  DO NOT shave, including legs and underarms, starting the day of your first shower.   You may shave your face at any point before/day of surgery.  Place clean sheets on your bed the day you start using CHG soap. Use a clean washcloth (not used since being washed) for each shower. DO NOT sleep with pets once you start using the CHG.  CHG Shower Instructions:  If you choose to wash your hair and private area, wash first with your normal shampoo/soap.  After you use shampoo/soap, rinse your hair and body thoroughly to remove shampoo/soap residue.  Turn the water OFF and apply about 3 tablespoons (45 ml) of CHG soap to a CLEAN washcloth.  Apply CHG soap ONLY FROM YOUR NECK DOWN TO YOUR TOES (washing for 3-5 minutes)  DO NOT use CHG soap on face, private areas, open wounds, or sores.  Pay special attention to the area where your surgery is being performed.  If you are having back  surgery, having someone wash your back for you may be helpful. Wait 2 minutes after CHG soap is applied, then you may rinse off the CHG soap.  Pat dry with a clean towel  Put on clean clothes/pajamas   If you choose to wear lotion, please use ONLY the CHG-compatible lotions on the back of this paper.     Additional instructions for the day of surgery: DO NOT APPLY any lotions, deodorants, cologne, or perfumes.   Put on clean/comfortable clothes.  Brush your teeth.  Ask your nurse before applying any prescription medications to the skin.   CHG Compatible Lotions   Aveeno Moisturizing lotion  Cetaphil Moisturizing Cream  Cetaphil Moisturizing Lotion  Clairol Herbal Essence Moisturizing Lotion, Dry Skin  Clairol Herbal Essence Moisturizing Lotion, Extra Dry Skin  Clairol Herbal Essence Moisturizing Lotion, Normal Skin  Curel Age Defying Therapeutic Moisturizing Lotion with Alpha Hydroxy  Curel Extreme Care Body Lotion  Curel Soothing Hands Moisturizing Hand Lotion  Curel Therapeutic Moisturizing Cream, Fragrance-Free  Curel Therapeutic Moisturizing Lotion, Fragrance-Free  Curel Therapeutic Moisturizing Lotion, Original Formula  Eucerin Daily Replenishing Lotion  Eucerin Dry Skin Therapy Plus Alpha Hydroxy Crme  Eucerin Dry Skin Therapy Plus Alpha Hydroxy Lotion  Eucerin Original Crme  Eucerin Original Lotion  Eucerin Plus Crme Eucerin Plus Lotion  Eucerin TriLipid Replenishing Lotion  Keri Anti-Bacterial Hand Lotion  Keri Deep Conditioning Original Lotion Dry Skin Formula Softly Scented  Keri Deep Conditioning Original Lotion, Fragrance Free Sensitive Skin Formula  Keri Lotion Fast Absorbing Fragrance Free Sensitive Skin Formula  Keri Lotion Fast Absorbing Softly Scented Dry Skin Formula  Keri Original Lotion  Keri Skin Renewal Lotion Keri Silky Smooth Lotion  Keri Silky Smooth Sensitive Skin Lotion  Nivea Body Creamy Conditioning Oil  Nivea Body Extra Enriched Lotion   Nivea Body Original Lotion  Nivea Body Sheer Moisturizing Lotion Nivea Crme  Nivea Skin Firming Lotion  NutraDerm 30 Skin Lotion  NutraDerm Skin Lotion  NutraDerm Therapeutic Skin Cream  NutraDerm Therapeutic Skin Lotion  ProShield Protective Hand Cream  Incentive Spirometer  An incentive spirometer is a tool that can help keep your lungs clear and active. This tool measures how well you are filling your lungs with each breath. Taking long deep breaths may help reverse or decrease the chance of developing breathing (pulmonary) problems (especially infection) following: A long period of time when you are unable to move or be active. BEFORE THE PROCEDURE  If the spirometer includes an indicator to show your best effort, your nurse or respiratory therapist will set it to a desired goal. If possible, sit up straight or lean slightly forward. Try not to slouch. Hold the incentive spirometer in an upright position. INSTRUCTIONS FOR USE  Sit on the edge of your bed if possible, or sit up as far as you can in bed or on a chair. Hold the incentive spirometer in an upright position. Breathe out normally. Place the mouthpiece in your mouth and seal your lips tightly around it. Breathe in  slowly and as deeply as possible, raising the piston or the ball toward the top of the column. Hold your breath for 3-5 seconds or for as long as possible. Allow the piston or ball to fall to the bottom of the column. Remove the mouthpiece from your mouth and breathe out normally. Rest for a few seconds and repeat Steps 1 through 7 at least 10 times every 1-2 hours when you are awake. Take your time and take a few normal breaths between deep breaths. The spirometer may include an indicator to show your best effort. Use the indicator as a goal to work toward during each repetition. After each set of 10 deep breaths, practice coughing to be sure your lungs are clear. If you have an incision (the cut made at the time  of surgery), support your incision when coughing by placing a pillow or rolled up towels firmly against it. Once you are able to get out of bed, walk around indoors and cough well. You may stop using the incentive spirometer when instructed by your caregiver.  RISKS AND COMPLICATIONS Take your time so you do not get dizzy or light-headed. If you are in pain, you may need to take or ask for pain medication before doing incentive spirometry. It is harder to take a deep breath if you are having pain. AFTER USE Rest and breathe slowly and easily. It can be helpful to keep track of a log of your progress. Your caregiver can provide you with a simple table to help with this. If you are using the spirometer at home, follow these instructions: SEEK MEDICAL CARE IF:  You are having difficultly using the spirometer. You have trouble using the spirometer as often as instructed. Your pain medication is not giving enough relief while using the spirometer. You develop fever of 100.5 F (38.1 C) or higher. SEEK IMMEDIATE MEDICAL CARE IF:  You cough up bloody sputum that had not been present before. You develop fever of 102 F (38.9 C) or greater. You develop worsening pain at or near the incision site. MAKE SURE YOU:  Understand these instructions. Will watch your condition. Will get help right away if you are not doing well or get worse.   WHAT IS A BLOOD TRANSFUSION? Blood Transfusion Information  A transfusion is the replacement of blood or some of its parts. Blood is made up of multiple cells which provide different functions. Red blood cells carry oxygen and are used for blood loss replacement. White blood cells fight against infection. Platelets control bleeding. Plasma helps clot blood. Other blood products are available for specialized needs, such as hemophilia or other clotting disorders. BEFORE THE TRANSFUSION  Who gives blood for transfusions?  Healthy volunteers who are fully  evaluated to make sure their blood is safe. This is blood bank blood. Transfusion therapy is the safest it has ever been in the practice of medicine. Before blood is taken from a donor, a complete history is taken to make sure that person has no history of diseases nor engages in risky social behavior (examples are intravenous drug use or sexual activity with multiple partners). The donor's travel history is screened to minimize risk of transmitting infections, such as malaria. The donated blood is tested for signs of infectious diseases, such as HIV and hepatitis. The blood is then tested to be sure it is compatible with you in order to minimize the chance of a transfusion reaction. If you or a relative donates blood, this is often  done in anticipation of surgery and is not appropriate for emergency situations. It takes many days to process the donated blood. RISKS AND COMPLICATIONS Although transfusion therapy is very safe and saves many lives, the main dangers of transfusion include:  Getting an infectious disease. Developing a transfusion reaction. This is an allergic reaction to something in the blood you were given. Every precaution is taken to prevent this. The decision to have a blood transfusion has been considered carefully by your caregiver before blood is given. Blood is not given unless the benefits outweigh the risks. AFTER THE TRANSFUSION Right after receiving a blood transfusion, you will usually feel much better and more energetic. This is especially true if your red blood cells have gotten low (anemic). The transfusion raises the level of the red blood cells which carry oxygen, and this usually causes an energy increase. The nurse administering the transfusion will monitor you carefully for complications. HOME CARE INSTRUCTIONS  No special instructions are needed after a transfusion. You may find your energy is better. Speak with your caregiver about any limitations on activity for  underlying diseases you may have. SEEK MEDICAL CARE IF:  Your condition is not improving after your transfusion. You develop redness or irritation at the intravenous (IV) site. SEEK IMMEDIATE MEDICAL CARE IF:  Any of the following symptoms occur over the next 12 hours: Shaking chills. You have a temperature by mouth above 102 F (38.9 C), not controlled by medicine. Chest, back, or muscle pain. People around you feel you are not acting correctly or are confused. Shortness of breath or difficulty breathing. Dizziness and fainting. You get a rash or develop hives. You have a decrease in urine output. Your urine turns a dark color or changes to pink, red, or brown. Any of the following symptoms occur over the next 10 days: You have a temperature by mouth above 102 F (38.9 C), not controlled by medicine. Shortness of breath. Weakness after normal activity. The white part of the eye turns yellow (jaundice). You have a decrease in the amount of urine or are urinating less often. Your urine turns a dark color or changes to pink, red, or brown.   [facility]- Preparing for Total Shoulder Arthroplasty    Before surgery, you can play an important role. Because skin is not sterile, your skin needs to be as free of germs as possible. You can reduce the number of germs on your skin by using the following products. Benzoyl Peroxide Gel Reduces the number of germs present on the skin Applied twice a day to shoulder area starting two days before surgery    ==================================================================  Please follow these instructions carefully:  BENZOYL PEROXIDE 5% GEL  Please do not use if you have an allergy to benzoyl peroxide.   If your skin becomes reddened/irritated stop using the benzoyl peroxide.  Starting two days before surgery, apply as follows: Apply benzoyl peroxide in the morning and at night. Apply after taking a shower. If you are not taking a shower  clean entire shoulder front, back, and side along with the armpit with a clean wet washcloth.  Place a quarter-sized dollop on your shoulder and rub in thoroughly, making sure to cover the front, back, and side of your shoulder, along with the armpit.   2 days before ____ AM   ____ PM              1 day before ____ AM   ____ PM  Do this twice a day for two days.  (Last application is the night before surgery, AFTER using the CHG soap as described below).  Do NOT apply benzoyl peroxide gel on the day of surgery.

## 2024-02-25 NOTE — Progress Notes (Addendum)
 COVID Vaccine received:  []  No [x]  Yes Date of any COVID positive Test in last 90 days: NO PCP - Fredia Skeeter MD Cardiologist - N/A  Chest x-ray - 08/07/23 Epic EKG -  08/07/23 Epic Stress Test -  ECHO -  Cardiac Cath -   Bowel Prep - [x]  No  []   Yes ______  Pacemaker / ICD device [x]  No []  Yes   Spinal Cord Stimulator:[x]  No []  Yes       History of Sleep Apnea? [x]  No []  Yes   CPAP used?- [x]  No []  Yes    Does the patient monitor blood sugar?          [x]  No []  Yes  []  N/A  Patient has: [x]  NO Hx DM   []  Pre-DM                 []  DM1  []   DM2 Does patient have a [employer] or Dexacom? []  No []  Yes   Fasting Blood Sugar Ranges-  Checks Blood Sugar _____ times a day  GLP1 agonist / usual dose - NO GLP1 instructions:  SGLT-2 inhibitors / usual dose - NO SGLT-2 instructions:   Blood Thinner / Instructions:no Aspirin Instructions:no  Comments:   Activity level: Patient is able  to climb a flight of stairs without difficulty; [x]  No CP  [x]  No SOB,   Patient can perform ADLs without assistance.   Anesthesia review:   Patient denies shortness of breath, fever, cough and chest pain at PAT appointment.  Patient verbalized understanding and agreement to the Pre-Surgical Instructions that were given to them at this PAT appointment. Patient was also educated of the need to review these PAT instructions again prior to his/her surgery.I reviewed the appropriate phone numbers to call if they have any and questions or concerns.

## 2024-02-26 ENCOUNTER — Other Ambulatory Visit: Payer: Self-pay

## 2024-02-26 ENCOUNTER — Encounter (HOSPITAL_COMMUNITY): Payer: Self-pay

## 2024-02-26 ENCOUNTER — Encounter (HOSPITAL_COMMUNITY)
Admission: RE | Admit: 2024-02-26 | Discharge: 2024-02-26 | Attending: Orthopedic Surgery | Admitting: Orthopedic Surgery

## 2024-02-26 VITALS — BP 140/76 | HR 80 | Temp 98.1°F | Resp 16 | Ht 61.0 in | Wt 218.0 lb

## 2024-02-26 DIAGNOSIS — Z01818 Encounter for other preprocedural examination: Secondary | ICD-10-CM

## 2024-02-26 HISTORY — DX: Essential (primary) hypertension: I10

## 2024-02-26 LAB — CBC
HCT: 43.2 % (ref 36.0–46.0)
Hemoglobin: 13.3 g/dL (ref 12.0–15.0)
MCH: 26.7 pg (ref 26.0–34.0)
MCHC: 30.8 g/dL (ref 30.0–36.0)
MCV: 86.6 fL (ref 80.0–100.0)
Platelets: 246 K/uL (ref 150–400)
RBC: 4.99 MIL/uL (ref 3.87–5.11)
RDW: 13.6 % (ref 11.5–15.5)
WBC: 8.6 K/uL (ref 4.0–10.5)
nRBC: 0 % (ref 0.0–0.2)

## 2024-02-26 LAB — TYPE AND SCREEN
ABO/RH(D): O POS
Antibody Screen: NEGATIVE

## 2024-02-26 LAB — SURGICAL PCR SCREEN
MRSA, PCR: NEGATIVE
Staphylococcus aureus: NEGATIVE

## 2024-03-10 ENCOUNTER — Encounter (HOSPITAL_COMMUNITY): Payer: Self-pay | Admitting: Orthopedic Surgery

## 2024-03-10 ENCOUNTER — Inpatient Hospital Stay (HOSPITAL_COMMUNITY)

## 2024-03-10 ENCOUNTER — Other Ambulatory Visit: Payer: Self-pay

## 2024-03-10 ENCOUNTER — Encounter (HOSPITAL_COMMUNITY)
Admission: RE | Disposition: A | Discharge status: Home / Self Care | Payer: Self-pay | Source: Home / Self Care | Attending: Orthopedic Surgery

## 2024-03-10 ENCOUNTER — Inpatient Hospital Stay (HOSPITAL_COMMUNITY): Admitting: Anesthesiology

## 2024-03-10 ENCOUNTER — Inpatient Hospital Stay (HOSPITAL_COMMUNITY)
Admission: RE | Admit: 2024-03-10 | Discharge: 2024-03-11 | DRG: 483 | Disposition: A | Discharge status: Home / Self Care | Attending: Orthopedic Surgery | Admitting: Orthopedic Surgery

## 2024-03-10 DIAGNOSIS — I10 Essential (primary) hypertension: Secondary | ICD-10-CM | POA: Diagnosis present

## 2024-03-10 DIAGNOSIS — Z96612 Presence of left artificial shoulder joint: Secondary | ICD-10-CM | POA: Diagnosis present

## 2024-03-10 DIAGNOSIS — T8484XA Pain due to internal orthopedic prosthetic devices, implants and grafts, initial encounter: Secondary | ICD-10-CM | POA: Diagnosis not present

## 2024-03-10 DIAGNOSIS — Z96653 Presence of artificial knee joint, bilateral: Secondary | ICD-10-CM | POA: Diagnosis present

## 2024-03-10 DIAGNOSIS — Z79899 Other long term (current) drug therapy: Secondary | ICD-10-CM | POA: Diagnosis not present

## 2024-03-10 DIAGNOSIS — Y838 Other surgical procedures as the cause of abnormal reaction of the patient, or of later complication, without mention of misadventure at the time of the procedure: Secondary | ICD-10-CM | POA: Diagnosis present

## 2024-03-10 DIAGNOSIS — Z96611 Presence of right artificial shoulder joint: Secondary | ICD-10-CM | POA: Diagnosis present

## 2024-03-10 DIAGNOSIS — T84098A Other mechanical complication of other internal joint prosthesis, initial encounter: Principal | ICD-10-CM | POA: Diagnosis present

## 2024-03-10 DIAGNOSIS — F418 Other specified anxiety disorders: Secondary | ICD-10-CM | POA: Diagnosis not present

## 2024-03-10 DIAGNOSIS — M810 Age-related osteoporosis without current pathological fracture: Secondary | ICD-10-CM | POA: Diagnosis present

## 2024-03-10 DIAGNOSIS — F32A Depression, unspecified: Secondary | ICD-10-CM | POA: Diagnosis present

## 2024-03-10 DIAGNOSIS — Z6841 Body Mass Index (BMI) 40.0 and over, adult: Secondary | ICD-10-CM

## 2024-03-10 DIAGNOSIS — M25511 Pain in right shoulder: Secondary | ICD-10-CM | POA: Diagnosis present

## 2024-03-10 DIAGNOSIS — Z96643 Presence of artificial hip joint, bilateral: Secondary | ICD-10-CM | POA: Diagnosis present

## 2024-03-10 DIAGNOSIS — Z96619 Presence of unspecified artificial shoulder joint: Principal | ICD-10-CM

## 2024-03-10 HISTORY — PX: TOTAL SHOULDER REVISION: SHX6130

## 2024-03-10 SURGERY — REVISION, TOTAL ARTHROPLASTY, SHOULDER
Anesthesia: General | Site: Shoulder | Laterality: Right

## 2024-03-10 MED ORDER — CEFAZOLIN SODIUM 1 G IJ SOLR
INTRAMUSCULAR | Status: AC
  Filled 2024-03-10: qty 20

## 2024-03-10 MED ORDER — METOCLOPRAMIDE HCL 5 MG/ML IJ SOLN: 5.0000 mg | Freq: Three times a day (TID) | INTRAMUSCULAR | Status: DC | PRN

## 2024-03-10 MED ORDER — TRANEXAMIC ACID 1000 MG/10ML IV SOLN: 1000.0000 mg | INTRAVENOUS | Status: DC

## 2024-03-10 MED ORDER — TRANEXAMIC ACID-NACL 1000-0.7 MG/100ML-% IV SOLN
INTRAVENOUS | Status: AC
  Filled 2024-03-10: qty 100

## 2024-03-10 MED ORDER — LIDOCAINE HCL (PF) 2 % IJ SOLN
INTRAMUSCULAR | Status: AC
  Filled 2024-03-10: qty 15

## 2024-03-10 MED ORDER — LIDOCAINE HCL (CARDIAC) PF 100 MG/5ML IV SOSY
PREFILLED_SYRINGE | INTRAVENOUS | Status: DC | PRN
  Administered 2024-03-10: 10:00:00 80 mg via INTRAVENOUS

## 2024-03-10 MED ORDER — DEXAMETHASONE SOD PHOSPHATE PF 10 MG/ML IJ SOLN
INTRAMUSCULAR | Status: DC | PRN
  Administered 2024-03-10: 10:00:00 8 mg via INTRAVENOUS

## 2024-03-10 MED ORDER — PROPOFOL 10 MG/ML IV BOLUS
INTRAVENOUS | Status: AC
  Filled 2024-03-10: qty 20

## 2024-03-10 MED ORDER — FENTANYL CITRATE (PF) 100 MCG/2ML IJ SOLN
INTRAMUSCULAR | Status: AC
  Filled 2024-03-10: qty 2

## 2024-03-10 MED ORDER — LACTATED RINGERS IV SOLN: INTRAVENOUS | Status: DC

## 2024-03-10 MED ORDER — HYDROMORPHONE HCL 1 MG/ML IJ SOLN: 0.2500 mg | INTRAMUSCULAR | Status: DC | PRN

## 2024-03-10 MED ORDER — CEFAZOLIN SODIUM-DEXTROSE 2-4 GM/100ML-% IV SOLN
2.0000 g | INTRAVENOUS | Status: AC
  Administered 2024-03-10 (×2): 2 g via INTRAVENOUS
  Filled 2024-03-10: qty 100

## 2024-03-10 MED ORDER — 0.9 % SODIUM CHLORIDE (POUR BTL) OPTIME
TOPICAL | Status: DC | PRN
  Administered 2024-03-10: 10:00:00 1000 mL

## 2024-03-10 MED ORDER — PHENOL 1.4 % MT LIQD: 1.0000 | OROMUCOSAL | Status: DC | PRN

## 2024-03-10 MED ORDER — PHENYLEPHRINE 80 MCG/ML (10ML) SYRINGE FOR IV PUSH (FOR BLOOD PRESSURE SUPPORT)
PREFILLED_SYRINGE | INTRAVENOUS | Status: AC
  Filled 2024-03-10: qty 10

## 2024-03-10 MED ORDER — ROCURONIUM BROMIDE 100 MG/10ML IV SOLN
INTRAVENOUS | Status: DC | PRN
  Administered 2024-03-10: 10:00:00 50 mg via INTRAVENOUS
  Administered 2024-03-10 (×5): 20 mg via INTRAVENOUS

## 2024-03-10 MED ORDER — FENTANYL CITRATE (PF) 50 MCG/ML IJ SOSY
PREFILLED_SYRINGE | INTRAMUSCULAR | Status: AC
  Filled 2024-03-10: qty 1

## 2024-03-10 MED ORDER — MENTHOL 3 MG MT LOZG: 1.0000 | LOZENGE | OROMUCOSAL | Status: DC | PRN

## 2024-03-10 MED ORDER — ORAL CARE MOUTH RINSE: 15.0000 mL | Freq: Once | OROMUCOSAL | Status: AC

## 2024-03-10 MED ORDER — ONDANSETRON HCL 4 MG/2ML IJ SOLN: 4.0000 mg | Freq: Four times a day (QID) | INTRAMUSCULAR | Status: DC | PRN

## 2024-03-10 MED ORDER — METHOCARBAMOL 1000 MG/10ML IJ SOLN: 500.0000 mg | Freq: Four times a day (QID) | INTRAMUSCULAR | Status: DC | PRN

## 2024-03-10 MED ORDER — ONDANSETRON HCL 4 MG PO TABS: 4.0000 mg | ORAL_TABLET | Freq: Four times a day (QID) | ORAL | Status: DC | PRN

## 2024-03-10 MED ORDER — BUPIVACAINE LIPOSOME 1.3 % IJ SUSP
INTRAMUSCULAR | Status: DC | PRN
  Administered 2024-03-10: 09:00:00 10 mL via PERINEURAL

## 2024-03-10 MED ORDER — CEFAZOLIN SODIUM-DEXTROSE 2-4 GM/100ML-% IV SOLN
2.0000 g | Freq: Four times a day (QID) | INTRAVENOUS | Status: AC
  Administered 2024-03-10 – 2024-03-11 (×2): 2 g via INTRAVENOUS
  Filled 2024-03-10 (×2): qty 100

## 2024-03-10 MED ORDER — ROCURONIUM BROMIDE 10 MG/ML (PF) SYRINGE
PREFILLED_SYRINGE | INTRAVENOUS | Status: AC
  Filled 2024-03-10: qty 30

## 2024-03-10 MED ORDER — FENTANYL 50 MCG/HR TD PT72: 1.0000 | MEDICATED_PATCH | TRANSDERMAL | Status: DC

## 2024-03-10 MED ORDER — METHOCARBAMOL 500 MG PO TABS: 500.0000 mg | ORAL_TABLET | Freq: Four times a day (QID) | ORAL | Status: DC | PRN

## 2024-03-10 MED ORDER — DULOXETINE HCL 60 MG PO CPEP: 60.0000 mg | ORAL_CAPSULE | Freq: Every day | ORAL | Status: DC

## 2024-03-10 MED ORDER — PROPOFOL 10 MG/ML IV BOLUS
INTRAVENOUS | Status: DC | PRN
  Administered 2024-03-10: 10:00:00 200 mg via INTRAVENOUS

## 2024-03-10 MED ORDER — ACETAMINOPHEN 325 MG PO TABS: 325.0000 mg | ORAL_TABLET | Freq: Four times a day (QID) | ORAL | Status: DC | PRN

## 2024-03-10 MED ORDER — CHLORHEXIDINE GLUCONATE 0.12 % MT SOLN
15.0000 mL | Freq: Once | OROMUCOSAL | Status: AC
  Administered 2024-03-10: 08:00:00 15 mL via OROMUCOSAL

## 2024-03-10 MED ORDER — SUGAMMADEX SODIUM 200 MG/2ML IV SOLN
INTRAVENOUS | Status: AC
  Filled 2024-03-10: qty 2

## 2024-03-10 MED ORDER — VANCOMYCIN HCL 1000 MG IV SOLR
INTRAVENOUS | Status: DC | PRN
  Administered 2024-03-10: 10:00:00 1000 mg via TOPICAL

## 2024-03-10 MED ORDER — OXYCODONE HCL 5 MG PO TABS: 10.0000 mg | ORAL_TABLET | ORAL | Status: DC | PRN

## 2024-03-10 MED ORDER — LACTATED RINGERS IV SOLN: INTRAVENOUS | Status: DC | PRN

## 2024-03-10 MED ORDER — VANCOMYCIN HCL 1000 MG IV SOLR
INTRAVENOUS | Status: AC
  Filled 2024-03-10: qty 20

## 2024-03-10 MED ORDER — FENTANYL CITRATE (PF) 50 MCG/ML IJ SOSY
50.0000 ug | PREFILLED_SYRINGE | INTRAMUSCULAR | Status: DC
  Administered 2024-03-10: 09:00:00 50 ug via INTRAVENOUS

## 2024-03-10 MED ORDER — HYDRALAZINE HCL 20 MG/ML IJ SOLN
INTRAMUSCULAR | Status: DC | PRN
  Administered 2024-03-10: 14:00:00 5 mg via INTRAVENOUS
  Administered 2024-03-10: 13:00:00 2 mg via INTRAVENOUS

## 2024-03-10 MED ORDER — METOCLOPRAMIDE HCL 5 MG PO TABS: 5.0000 mg | ORAL_TABLET | Freq: Three times a day (TID) | ORAL | Status: DC | PRN

## 2024-03-10 MED ORDER — DIPHENHYDRAMINE HCL 12.5 MG/5ML PO ELIX: 12.5000 mg | ORAL_SOLUTION | ORAL | Status: DC | PRN

## 2024-03-10 MED ORDER — BUPIVACAINE-EPINEPHRINE (PF) 0.5% -1:200000 IJ SOLN
INTRAMUSCULAR | Status: DC | PRN
  Administered 2024-03-10: 09:00:00 15 mL via PERINEURAL

## 2024-03-10 MED ORDER — HYDROMORPHONE HCL 1 MG/ML IJ SOLN: 0.5000 mg | INTRAMUSCULAR | Status: DC | PRN

## 2024-03-10 MED ORDER — OXYCODONE HCL 5 MG PO TABS: 5.0000 mg | ORAL_TABLET | ORAL | Status: DC | PRN

## 2024-03-10 MED ORDER — METOPROLOL SUCCINATE ER 25 MG PO TB24
25.0000 mg | ORAL_TABLET | Freq: Every day | ORAL | Status: DC
  Administered 2024-03-11: 25 mg via ORAL
  Filled 2024-03-10: qty 1

## 2024-03-10 MED ORDER — ONDANSETRON HCL 4 MG/2ML IJ SOLN
INTRAMUSCULAR | Status: DC | PRN
  Administered 2024-03-10: 11:00:00 4 mg via INTRAVENOUS

## 2024-03-10 MED ORDER — TRANEXAMIC ACID-NACL 1000-0.7 MG/100ML-% IV SOLN
1000.0000 mg | INTRAVENOUS | Status: AC
  Administered 2024-03-10: 10:00:00 1000 mg via INTRAVENOUS

## 2024-03-10 MED ORDER — POLYETHYLENE GLYCOL 3350 17 G PO PACK: 17.0000 g | PACK | Freq: Every day | ORAL | Status: DC | PRN

## 2024-03-10 MED ORDER — ACETAMINOPHEN 500 MG PO TABS
1000.0000 mg | ORAL_TABLET | Freq: Once | ORAL | Status: AC
  Administered 2024-03-10: 08:00:00 1000 mg via ORAL
  Filled 2024-03-10: qty 2

## 2024-03-10 MED ORDER — PANTOPRAZOLE SODIUM 40 MG PO TBEC
40.0000 mg | DELAYED_RELEASE_TABLET | Freq: Every day | ORAL | Status: DC
  Administered 2024-03-10 – 2024-03-11 (×2): 40 mg via ORAL
  Filled 2024-03-10 (×2): qty 1

## 2024-03-10 MED ORDER — STERILE WATER FOR IRRIGATION IR SOLN
Status: DC | PRN
  Administered 2024-03-10: 10:00:00 2000 mL

## 2024-03-10 MED ADMIN — Sugammadex Sodium IV 200 MG/2ML (Base Equivalent): 200 mg | INTRAVENOUS | @ 15:00:00 | NDC 99999070036

## 2024-03-10 MED ADMIN — Propofol IV Emul 500 MG/50ML (10 MG/ML): 100 ug/kg/min | INTRAVENOUS | @ 10:00:00 | NDC 00069023420

## 2024-03-10 SURGICAL SUPPLY — 77 items
BAG COUNTER SPONGE SURGICOUNT (BAG) IMPLANT
BAG ZIPLOCK 12X15 (MISCELLANEOUS) ×1 IMPLANT
BLADE OSTEOTOME FLAT 12X5 (BLADE) IMPLANT
BLADE OSTEOTOME FLAT 6X9 (BLADE) IMPLANT
BLADE OSTEOTOME FLAT 8X11 (BLADE) IMPLANT
BLADE OSTEOTOME FLAT 8X5 (BLADE) IMPLANT
BLADE SAW SGTL 81X20 HD (BLADE) IMPLANT
BLADE SAW SGTL 83.5X18.5 (BLADE) IMPLANT
BNDG COHESIVE 4X5 TAN STRL LF (GAUZE/BANDAGES/DRESSINGS) ×1 IMPLANT
CNTNR URN SCR LID CUP LEK RST (MISCELLANEOUS) IMPLANT
COOLER ICEMAN CLASSIC (MISCELLANEOUS) ×1 IMPLANT
COVER BACK TABLE 60X90IN (DRAPES) ×1 IMPLANT
COVER SURGICAL LIGHT HANDLE (MISCELLANEOUS) ×1 IMPLANT
CUP SUT UNIV REVERS 36 NEUTRAL (Cup) IMPLANT
DRAPE C-ARM 42X120 X-RAY (DRAPES) IMPLANT
DRAPE SHEET LG 3/4 BI-LAMINATE (DRAPES) ×1 IMPLANT
DRAPE SURG 17X11 SM STRL (DRAPES) ×1 IMPLANT
DRAPE SURG ORHT 6 SPLT 77X108 (DRAPES) ×2 IMPLANT
DRAPE TOP 10253 STERILE (DRAPES) ×1 IMPLANT
DRAPE U-SHAPE 47X51 STRL (DRAPES) ×1 IMPLANT
DRESSING AQUACEL AG SP 3.5X6 (GAUZE/BANDAGES/DRESSINGS) IMPLANT
DRSG AQUACEL AG ADV 3.5X10 (GAUZE/BANDAGES/DRESSINGS) IMPLANT
DURAPREP 26ML APPLICATOR (WOUND CARE) ×1 IMPLANT
ELECT BLADE TIP CTD 4 INCH (ELECTRODE) ×1 IMPLANT
ELECT PENCIL ROCKER SW 15FT (MISCELLANEOUS) ×1 IMPLANT
ELECT REM PT RETURN 15FT ADLT (MISCELLANEOUS) ×1 IMPLANT
FACESHIELD WRAPAROUND OR TEAM (MASK) ×5 IMPLANT
FIBERTAPE CERCLAGE TLINK SUT (SUTURE) IMPLANT
GLENOID UNI REV MOD 24 +2 LAT (Joint) IMPLANT
GLENOSPHERE 36 +4 LAT/24 (Joint) IMPLANT
GLENOSPHERE SYSTEM 36/24 (Shoulder) IMPLANT
GLOVE BIO SURGEON STRL SZ7.5 (GLOVE) ×1 IMPLANT
GLOVE BIO SURGEON STRL SZ8 (GLOVE) ×1 IMPLANT
GLOVE SS BIOGEL STRL SZ 7 (GLOVE) ×1 IMPLANT
GLOVE SS BIOGEL STRL SZ 7.5 (GLOVE) ×1 IMPLANT
GOWN SPEC L4 XLG W/TWL (GOWN DISPOSABLE) ×1 IMPLANT
GOWN STRL SURGICAL XL XLNG (GOWN DISPOSABLE) ×1 IMPLANT
INSERT HUMERAL UNI REVERS 36 3 (Insert) IMPLANT
KIT BASIN OR (CUSTOM PROCEDURE TRAY) ×1 IMPLANT
KIT TURNOVER KIT A (KITS) ×1 IMPLANT
LAVAGE JET IRRISEPT WOUND (IRRIGATION / IRRIGATOR) IMPLANT
MANIFOLD NEPTUNE II (INSTRUMENTS) ×1 IMPLANT
NDL TAPERED W/ NITINOL LOOP (MISCELLANEOUS) ×1 IMPLANT
NEEDLE TAPERED W/ NITINOL LOOP (MISCELLANEOUS) ×1 IMPLANT
NS IRRIG 1000ML POUR BTL (IV SOLUTION) ×1 IMPLANT
PACK SHOULDER (CUSTOM PROCEDURE TRAY) ×1 IMPLANT
PAD ARMBOARD POSITIONER FOAM (MISCELLANEOUS) ×1 IMPLANT
PAD COLD SHLDR WRAP-ON (PAD) ×1 IMPLANT
PIN SET MODULAR GLENOID SYSTEM (PIN) ×1 IMPLANT
RESTRAINT HEAD UNIVERSAL NS (MISCELLANEOUS) ×1 IMPLANT
SCREW CENTRAL MODULAR 25 (Screw) IMPLANT
SCREW PERI LOCK 5.5X16 (Screw) IMPLANT
SCREW PERI LOCK 5.5X32 (Screw) IMPLANT
SCREW PERI LOCK 5.5X36 (Screw) IMPLANT
SCREW PERIPHERAL 5.5X20 LOCK (Screw) IMPLANT
SET HNDPC FAN SPRY TIP SCT (DISPOSABLE) ×1 IMPLANT
SLING ARM FOAM STRAP LRG (SOFTGOODS) IMPLANT
SLING ARM FOAM STRAP MED (SOFTGOODS) IMPLANT
SOLUTION IRRIG SURGIPHOR (IV SOLUTION) IMPLANT
SPACER SHLD UNI REV 36 +6 (Shoulder) IMPLANT
SPONGE T-LAP 18X18 ~~LOC~~+RFID (SPONGE) IMPLANT
STEM CAP COATED SZ 9 REV 180 (Miscellaneous) IMPLANT
STEM HUMERAL SZ6 SHOULDER CAP (Joint) IMPLANT
STRIP CLOSURE SKIN 1/2X4 (GAUZE/BANDAGES/DRESSINGS) ×1 IMPLANT
SUCTION TUBE FRAZIER 12FR DISP (SUCTIONS) IMPLANT
SUT MNCRL AB 3-0 PS2 18 (SUTURE) ×1 IMPLANT
SUT MON AB 2-0 CT1 36 (SUTURE) ×1 IMPLANT
SUT VIC AB 1 CT1 36 (SUTURE) ×1 IMPLANT
SUTURE TAPE 1.3 40 TPR END (SUTURE) ×2 IMPLANT
SWAB COLLECTION DEVICE MRSA (MISCELLANEOUS) IMPLANT
SWAB CULTURE ESWAB REG 1ML (MISCELLANEOUS) IMPLANT
TOWEL GREEN STERILE FF (TOWEL DISPOSABLE) ×1 IMPLANT
TOWEL OR DSP ST BLU DLX 10/PK (DISPOSABLE) ×1 IMPLANT
TOWER SMARTMIX MINI (MISCELLANEOUS) IMPLANT
TUBE SUCTION HIGH CAP CLEAR NV (SUCTIONS) ×1 IMPLANT
TUBING CONNECTING 10 (TUBING) ×1 IMPLANT
WATER STERILE IRR 1000ML POUR (IV SOLUTION) ×1 IMPLANT

## 2024-03-10 NOTE — Op Note (Signed)
 03/10/2024  2:46 PM  PATIENT:   Carmen Strickland  58 y.o. female  PRE-OPERATIVE DIAGNOSIS: Painful right shoulder anatomic arthroplasty  POST-OPERATIVE DIAGNOSIS: Same  PROCEDURE: Exploration of right shoulder anatomic arthroplasty with explantation and conversion to reverse shoulder arthroplasty utilizing a size 9 Arthrex longstem revision humeral implant with a neutral metathesis, +6 spacer, +3 constrained polyethylene insert, 36/+4 glenosphere on a small/+2 baseplate  SURGEON:  Zephyr Sausedo, Franky BATTLE M.D.  ASSISTANTS: Randine Ricks, PA-C  Randine Ricks, PA-C was utilized as an geophysicist/field seismologist throughout this case, essential for help with positioning the patient, positioning extremity, tissue manipulation, implantation of the prosthesis, suture management, wound closure, and intraoperative decision-making.  ANESTHESIA:   General Endotracheal and interscalene block with Exparel   EBL: 450 cc  SPECIMEN: Tissue was taken for routine culture and sensitivity x 3, anterior capsule, humeral metaphysis, and glenoid.  Drains: None   PATIENT DISPOSITION:  PACU - hemodynamically stable.    PLAN OF CARE: Admit for overnight observation  Brief history:  Patient is a 58 year old female well-known to our practice status post a right shoulder anatomic arthroplasty that I performed back in 2014 who had done well for many years but presents now with progressive increasing right shoulder pain which has been refractory to prolonged attempts at conservative management.  We had previously worked her up with baseline labs which showed normal inflammatory parameters, CT scan which did not show any obvious to suggest periprosthetic fracture or gross loosening of the implant, as well as bone scan which did not show obvious changes to suggest a loosening or infection.  Despite the negative workup she continued to complain of severe right shoulder pain although her clinical examination demonstrated functional motion and  overall good strength.  She was unwilling and unable to continue living with her shoulder pain the way that it was and at this time she is brought to the operating for planned exploration and conversion to reverse shoulder arthroplasty.  Preop the patient was counseled regarding treatment option as well as the potential risk versus benefits there.  Possible surgical complications were reviewed including the potential for bleeding, infection, neurovascular injury, persistence of pain, loss of motion, failure of the implant, anesthetic complication, and possible need for additional surgery.  She understands, and accepts, and agrees with the planned procedure.  Procedure detail:  After undergoing routine preop evaluation the patient received prophylactic antibiotics and interscalene block with Exparel  was established in the holding area by the anesthesia department.  Subsequently placed spine on the operating table and underwent the smooth induction of a general endotracheal anesthesia.  Placed into the beachchair position and appropriately padded and protected.  The right shoulder girdle region was sterilely prepped and draped in standard fashion.  Timeout was called.  We approach the right shoulder through her previous deltopectoral incision approximately 10 cm in length.  Skin flaps elevated mobilized and dissection carried deeply and the deltopectoral interval was then developed from proximal to distal with the vein taken laterally.  Significant scarring was found beneath the deltoid and this interval was dissected with a blunt and sharp dissection and in addition the conjoined tendon was mobilized and separated from the underlying soft tissues to allow deep exposure.  The anterior capsule was then divided with electrocautery and a small amount of clear synovial fluid was encountered.  I then performed a subperiosteal dissection around the proximal humeral metaphysis and dissected adhesions beneath the  deltoid and then separated the superior rotator cuff and the subscapularis from the margin  of the humeral metaphysis and tagged the margin of the subscap with grasping suture tape sutures x 2.  We then completed subperiosteal dissection around the medial calcar region which allowed us  to deliver the humeral head through the wound.  A sample of the capsule tissue anteriorly was sent for routine culture.  We then removed the humeral head and used a rongeurs to remove some synovial tissues that had developed around the margins of the implant and some metaphyseal soft tissue was sent for culture.  With the head removed we then elected to proceed with the evaluation of the glenoid to use the humeral implant to help protect the metaphyseal bone.  We then exposed glenoid and performed a circumferential resection of the capsular tissues to allow complete visualization of the polyethylene glenoid.  The glenoid itself did not show any obvious unusual wear patterns.  It was however easily extracted and I suspect that there was some looseness beneath the glenoid implant.  It was removed and essentially 1 piece with the 2 inferior pegs separated but these were retrieved as well.  Then based on the preoperative imaging to include a CT scan we selected a starting point for our glenoid guide pin and this was placed achieving good bony purchase.  We then prepared the glenoid with the central followed by the peripheral reamer down to stable subchondral bony bed and curetted the previous peg holes and meticulously irrigated and cleaned to remove soft tissues.  We then completed a preparation with a drill and tapped for a 25 mm lag screw.  Our baseplate was then assembled and inserted with vancomycin  powder applied to the threads of the lag screw and excellent fixation was achieved.  All of the peripheral locking screws were then placed using standard technique with excellent fixation.  At this point we then returned our attention back  to the humeral metaphysis and proceeded to gain exposure around the bone prosthesis interface proximally and we used flexible osteotomes to separate the implant from the underlying bone and unfortunately found that the implant was extremely well-fixed and it was unfortunately extremely difficult to remove the implant and ultimately necessitated us  to perform a sequence of 2 osteotomies of the humeral shaft to gain access to ultimately free up the humeral stem and in the process there was separation of the greater tuberosity from the humeral shaft and 2 additional fragments of bone of the humeral shaft separating medially but this was necessary such that we could ultimately remove the extremely well-fixed stem.  Ultimately stem was removed.  There was a significant pedestal at the distal aspect of the stem and so we did call in fluoroscopic imaging and used this to confirm that a guidepin could be passed across the pedestal and then sequentially reamed up such that we could pass a longstem implant beyond the region of the pedestal.  We initially felt that a size 6 stem would be appropriate however upon implantation the size 16 was grossly loose within the humerus and so we upsized to a 9 stem and this did require repeated reaming which we did perform under fluoroscopic guidance to confirm that we stayed intramedullary and upon completion we are able to pass the appropriate series of reamers down into the distal segment of the humeral shaft beyond the pedestal and then ultimately assembled a size 9 stem to a neutral metathesis and the final implant was then impacted at 20 degrees of retroversion.  We then passed a series of 2 fiber  cerclage at the midportion of the humeral shaft to help close down the open book osteotomy that we had made and this allowed excellent fixation and then more proximally we to more fiber cerclage around the greater tuberosity and a medial calcar fragment of bone attaching nicely and securely  to the humeral stem.  Prior to the final construct repair we did confirm the reduction could be obtained and we did feel as though a +6 spacer plus a +3 poly gave us  the best soft tissue balance and so we applied the spacer and impacted the final poly and performed a final reduction and with the soft tissue balance to our satisfaction we went ahead and completed the repair of the bone fragments around the newly placed size 9 Arthrex revision stem.  We then repaired the subscapularis back to the eyelets on the collar of the implant using the previously placed suture tape sutures again confirming that subscapularis had been properly mobilized with good elasticity.  I should mention that throughout the case we did a combination of iodophor irrigation and Irrisept irrigation throughout the wound and pulsatile lavage as well.  A final irrigation was then completed.  Hemostasis was obtained.  The wound was closed with the deep fascia reapproximated distally with #1 Vicryl and we did identify an area of the cephalic vein that had been compromised so we did ligate the vein distally.  Proximal the deltopectoral interval was closed under standard technique.  2-0 Monocryl used for the subcu layer and intracuticular 3-0 Monocryl for the skin followed by Steri-Strips and an Aquacel dressing.  The right arm was then placed into a sling and the patient was awakened, extubated, and taken to the recovery in stable condition.  Franky CHRISTELLA Pointer MD   Contact # [phone]

## 2024-03-10 NOTE — H&P (Signed)
 Carmen Strickland    Chief Complaint: Failed Right shoulder anatomic arthroplasty HPI: The patient is a 58 y.o. female well-known to our practice with previous history of right shoulder anatomic arthroplasty that we performed back in 2014.  She recently presented with increasing right shoulder pain after a fall.  Workup earlier this year identified normal inflammatory parameters, negative CT scan for obvious periprosthetic fracture or loosening and a negative bone scan.  Despite continued conservative management she has ongoing significant right shoulder pain with concern for rotator cuff failure and she is brought the operating room today for planned revision and conversion to reverse arthroplasty versus CTA.  Past Medical History:  Diagnosis Date   Anxiety    Arthritis    Chronic back pain    scoliosis and stenosis. Buldging disc   Complication of anesthesia    pt states easily sedated    Depression    takes Prozac  daily   Developmental displacement hip    History of blood transfusion    Hypertension    Joint pain    Joint swelling    Osteoporosis    PONV (postoperative nausea and vomiting)    as a child, no problems as an adult with PONV   Urinary urgency       Past Surgical History:  Procedure Laterality Date   CARPAL TUNNEL RELEASE Bilateral    COLONOSCOPY WITH PROPOFOL  N/A 12/31/2015   Procedure: COLONOSCOPY WITH PROPOFOL ;  Surgeon: Elsie Cree, MD;  Location: [facility] ENDOSCOPY;  Service: Endoscopy;  Laterality: N/A;   cyst removed from left breast     EYE SURGERY     Lasik   HIP SURGERY     x 9 as a child   JOINT REPLACEMENT     bil.hips/bil knees/ bil shoulders   total of 6 surg   RADIOLOGY WITH ANESTHESIA N/A 11/16/2018   Procedure: MRI WITH ANESTHESIA LUMBAR WITHOUT CONTRAST;  Surgeon: Radiologist, Medication, MD;  Location: [facility] OR;  Service: Radiology;  Laterality: N/A;   RADIOLOGY WITH ANESTHESIA N/A 01/11/2019   Procedure: MRI THORACIC SPINE WITHOUT CONTRAST;   Surgeon: Radiologist, Medication, MD;  Location: [facility] OR;  Service: Radiology;  Laterality: N/A;   right knee surgery  at age 46   TOTAL SHOULDER ARTHROPLASTY Right 03/03/2013   Procedure: RIGHT TOTAL SHOULDER ARTHROPLASTY;  Surgeon: Franky CHRISTELLA Pointer, MD;  Location: [facility] OR;  Service: Orthopedics;  Laterality: Right;   TOTAL SHOULDER ARTHROPLASTY Left 11/03/2013   Procedure: LEFT TOTAL SHOULDER ARTHROPLASTY;  Surgeon: Franky CHRISTELLA Pointer, MD;  Location: [facility] OR;  Service: Orthopedics;  Laterality: Left;   TOTAL SHOULDER REPLACEMENT Right 03/03/2013   DR Abaigeal Moomaw    WISDOM TOOTH EXTRACTION      Family History  Problem Relation Age of Onset   Multiple sclerosis Mother    Breast cancer Mother     Social History:  reports that she has never smoked. She has never used smokeless tobacco. She reports that she does not currently use alcohol after a past usage of about 14.0 standard drinks of alcohol per week. She reports that she does not use drugs.  BMI: Estimated body mass index is 41.19 kg/m as calculated from the following:   Height as of 02/26/24: 5' 1 (1.549 m).   Weight as of 02/26/24: 98.9 kg.  Lab Results  Component Value Date   ALBUMIN 3.6 08/07/2023   Diabetes: Patient does not have a diagnosis of diabetes.     Smoking Status:   reports that she has never  smoked. She has never used smokeless tobacco.     No medications prior to admission.     Physical Exam: Previous right shoulder incision is well-healed although there is some slight webbing extending towards the apex of the axilla distally along the incision.  There is no ecchymosis, erythema, or induration.  Active elevation approximate 120.  Strength is globally decreased with pain throughout range of motion.  Examination otherwise as we noted at her recent office visits  Imaging: Recent plain films do not show any obvious gross loosening of the implant or periprosthetic fracture.  Previous workup included negative CT as well as bone  scan and normal inflammatory parameters.  Vitals     Assessment/Plan  Impression: Failed Right shoulder anatomic arthroplasty  Plan of Action: Procedures: REVISION, TOTAL ARTHROPLASTY, SHOULDER with anticipated conversion to reverse versus CTA hemiarthroplasty.  Quenesha Douglass M Sachi Boulay 03/10/2024, 5:52 AM Contact # [phone]

## 2024-03-10 NOTE — Anesthesia Postprocedure Evaluation (Addendum)
 Anesthesia Post Note  Patient: Carmen Strickland  Procedure(s) Performed: REVISION, TOTAL ARTHROPLASTY, SHOULDER (Right: Shoulder)     Patient location during evaluation: PACU Anesthesia Type: General and Regional Level of consciousness: awake Pain management: pain level controlled Vital Signs Assessment: post-procedure vital signs reviewed and stable Respiratory status: spontaneous breathing, nonlabored ventilation, respiratory function stable and patient connected to nasal cannula oxygen Cardiovascular status: blood pressure returned to baseline and stable Postop Assessment: no apparent nausea or vomiting Anesthetic complications: no   No notable events documented.  Last Vitals:  Vitals:   03/10/24 1513 03/10/24 1545  BP: 107/71   Pulse: 94   Resp: 18   Temp: 36.5 C   SpO2: 98% 95%    Last Pain:  Vitals:   03/10/24 1513  TempSrc:   PainSc: 0-No pain                 Lujain Kraszewski,W. EDMOND

## 2024-03-10 NOTE — Transfer of Care (Addendum)
 Immediate Anesthesia Transfer of Care Note  Patient: Carmen Strickland  Procedure(s) Performed: REVISION, TOTAL ARTHROPLASTY, SHOULDER (Right: Shoulder)  Patient Location: PACU  Anesthesia Type:GA combined with regional for post-op pain  Level of Consciousness: awake and alert   Airway & Oxygen Therapy: Patient Spontanous Breathing and Patient connected to nasal cannula oxygen  Post-op Assessment: Report given to RN and Post -op Vital signs reviewed and stable  Post vital signs: Reviewed and stable  Last Vitals:  Vitals Value Taken Time  BP 112/87 03/10/24 15:15  Temp 36.5 C 03/10/24 15:13  Pulse 87 03/10/24 15:23  Resp 19 03/10/24 15:23  SpO2 100 % 03/10/24 15:23  Vitals shown include unfiled device data.  Last Pain:  Vitals:   03/10/24 1513  TempSrc:   PainSc: 0-No pain         Complications: No notable events documented.

## 2024-03-10 NOTE — Anesthesia Procedure Notes (Signed)
 Procedure Name: Intubation Date/Time: 03/10/2024 9:57 AM  Performed by: Dartha Meckel, CRNAPre-anesthesia Checklist: Patient identified, Emergency Drugs available, Suction available and Patient being monitored Patient Re-evaluated:Patient Re-evaluated prior to induction Oxygen Delivery Method: Circle system utilized Preoxygenation: Pre-oxygenation with 100% oxygen Induction Type: IV induction Ventilation: Mask ventilation without difficulty Laryngoscope Size: Glidescope and 3 Grade View: Grade I Tube type: Oral Tube size: 7.0 mm Number of attempts: 1 Airway Equipment and Method: Stylet and Oral airway Placement Confirmation: ETT inserted through vocal cords under direct vision, positive ETCO2 and breath sounds checked- equal and bilateral Secured at: 21 cm Tube secured with: Tape Dental Injury: Teeth and Oropharynx as per pre-operative assessment

## 2024-03-10 NOTE — Anesthesia Preprocedure Evaluation (Addendum)
 Anesthesia Evaluation  Patient identified by MRN, date of birth, ID band Patient awake    Reviewed: Allergy & Precautions, H&P , NPO status , Patient's Chart, lab work & pertinent test results, reviewed documented beta blocker date and time   History of Anesthesia Complications (+) PONV and history of anesthetic complications  Airway Mallampati: III  TM Distance: >3 FB Neck ROM: Full    Dental no notable dental hx. (+) Teeth Intact, Dental Advisory Given   Pulmonary neg pulmonary ROS   Pulmonary exam normal breath sounds clear to auscultation       Cardiovascular hypertension, Pt. on medications and Pt. on home beta blockers  Rhythm:Regular Rate:Normal     Neuro/Psych   Anxiety Depression    negative neurological ROS     GI/Hepatic negative GI ROS, Neg liver ROS,,,  Endo/Other    Class 3 obesity  Renal/GU negative Renal ROS  negative genitourinary   Musculoskeletal  (+) Arthritis , Osteoarthritis,    Abdominal   Peds  Hematology negative hematology ROS (+)   Anesthesia Other Findings   Reproductive/Obstetrics negative OB ROS                              Anesthesia Physical Anesthesia Plan  ASA: 3  Anesthesia Plan: General   Post-op Pain Management: Regional block* and Tylenol  PO (pre-op)*   Induction: Intravenous  PONV Risk Score and Plan: 4 or greater and Ondansetron , Dexamethasone  and Midazolam   Airway Management Planned: Oral ETT and Video Laryngoscope Planned  Additional Equipment:   Intra-op Plan:   Post-operative Plan: Extubation in OR  Informed Consent: I have reviewed the patients History and Physical, chart, labs and discussed the procedure including the risks, benefits and alternatives for the proposed anesthesia with the patient or authorized representative who has indicated his/her understanding and acceptance.     Dental advisory given  Plan Discussed  with: CRNA  Anesthesia Plan Comments:          Anesthesia Quick Evaluation

## 2024-03-10 NOTE — Anesthesia Procedure Notes (Signed)
 Anesthesia Regional Block: Interscalene brachial plexus block   Pre-Anesthetic Checklist: , timeout performed,  Correct Patient, Correct Site, Correct Laterality,  Correct Procedure, Correct Position, site marked,  Risks and benefits discussed,  Pre-op evaluation,  At surgeon's request and post-op pain management  Laterality: Right  Prep: Maximum Sterile Barrier Precautions used, chloraprep       Needles:  Injection technique: Single-shot  Needle Type: Echogenic Stimulator Needle     Needle Length: 5cm  Needle Gauge: 22     Additional Needles:   Procedures:,,,, ultrasound used (permanent image in chart),,    Narrative:  Start time: 03/10/2024 8:45 AM End time: 03/10/2024 8:55 AM Injection made incrementally with aspirations every 5 mL.  Performed by: Personally  Anesthesiologist: Epifanio Fallow, MD  Additional Notes:

## 2024-03-11 ENCOUNTER — Other Ambulatory Visit (HOSPITAL_COMMUNITY): Payer: Self-pay

## 2024-03-11 ENCOUNTER — Encounter (HOSPITAL_COMMUNITY): Payer: Self-pay | Admitting: Orthopedic Surgery

## 2024-03-11 MED ORDER — HYDROMORPHONE HCL 4 MG PO TABS
4.0000 mg | ORAL_TABLET | ORAL | 0 refills | Status: AC | PRN
  Filled 2024-03-11: qty 30, 7d supply, fill #0

## 2024-03-11 NOTE — Discharge Summary (Signed)
 PATIENT ID:      Carmen Strickland  MRN:     [id number] DOB/AGE:    26-Mar-1965 / 58 y.o.     DISCHARGE SUMMARY  ADMISSION DATE:    03/10/2024 DISCHARGE DATE:  03/11/2025  ADMISSION DIAGNOSIS: Failed Right shoulder anatomic arthroplasty Past Medical History:  Diagnosis Date   Anxiety    Arthritis    Chronic back pain    scoliosis and stenosis. Buldging disc   Complication of anesthesia    pt states easily sedated    Depression    takes Prozac  daily   Developmental displacement hip    History of blood transfusion    Hypertension    Joint pain    Joint swelling    Osteoporosis    PONV (postoperative nausea and vomiting)    as a child, no problems as an adult with PONV   Urinary urgency     DISCHARGE DIAGNOSIS:   Principal Problem:   History of revision of total shoulder arthroplasty   PROCEDURE: Procedures: REVISION, TOTAL ARTHROPLASTY, SHOULDER on 03/10/2024  CONSULTS:    HISTORY:  See H&P in chart.  HOSPITAL COURSE:  Carmen Strickland is a 58 y.o. admitted on 03/10/2024 with a diagnosis of Failed Right shoulder anatomic arthroplasty.  They were brought to the operating room on 03/10/2024 and underwent Procedures: REVISION, TOTAL ARTHROPLASTY, SHOULDER.    They were given perioperative antibiotics:  Anti-infectives (From admission, onward)    Start     Dose/Rate Route Frequency Ordered Stop   03/10/24 2000  ceFAZolin  (ANCEF ) IVPB 2g/100 mL premix        2 g 200 mL/hr over 30 Minutes Intravenous Every 6 hours 03/10/24 1723 03/11/24 0227   03/10/24 1025  vancomycin  (VANCOCIN ) powder  Status:  Discontinued          As needed 03/10/24 1025 03/10/24 1509   03/10/24 0830  ceFAZolin  (ANCEF ) IVPB 2g/100 mL premix        2 g 200 mL/hr over 30 Minutes Intravenous On call to O.R. 03/10/24 0730 03/10/24 1412     .  Patient underwent the above named procedure and tolerated it well. The following day they were hemodynamically stable and pain was controlled on oral analgesics.  They were neurovascularly intact to the operative extremity. OT was ordered and worked with patient per protocol. They were medically and orthopaedically stable for discharge on .    DIAGNOSTIC STUDIES:  RECENT RADIOGRAPHIC STUDIES :  DG Shoulder 1V Right Result Date: 03/10/2024 CLINICAL DATA:  Elective surgery. EXAM: RIGHT SHOULDER - 1 VIEW COMPARISON:  Shoulder CT 08/03/2023 FINDINGS: Nine fluoroscopic spot views of the shoulder submitted from the operating room. Periprosthetic proximal humeral fracture. The previous glenohumeral arthroplasty has been removed. Subsequent reverse shoulder arthroplasty. Fluoroscopy time 17 seconds. Dose 1.16 mGy. IMPRESSION: Intraoperative fluoroscopy during right shoulder surgery. Electronically Signed   By: Andrea Gasman M.D.   On: 03/10/2024 16:19   DG C-Arm 1-60 Min-No Report Result Date: 03/10/2024 Fluoroscopy was utilized by the requesting physician.  No radiographic interpretation.   DG C-Arm 1-60 Min-No Report Result Date: 03/10/2024 Fluoroscopy was utilized by the requesting physician.  No radiographic interpretation.    RECENT VITAL SIGNS:  Patient Vitals for the past 24 hrs:  BP Temp Temp src Pulse Resp SpO2 Height Weight  03/11/24 0623 112/66 98.4 F (36.9 C) Oral 76 17 94 % -- --  03/11/24 0157 117/70 98.6 F (37 C) Oral 82 17 93 % -- --  03/10/24 2241 ROLLEN)  112/59 98.6 F (37 C) Oral 82 17 92 % -- --  03/10/24 1919 (!) 143/93 97.6 F (36.4 C) Oral 81 17 100 % -- --  03/10/24 1722 124/70 (!) 97.3 F (36.3 C) Oral 76 17 100 % -- --  03/10/24 1630 130/75 -- -- 68 11 98 % -- --  03/10/24 1615 131/70 -- -- 68 11 97 % -- --  03/10/24 1600 125/79 -- -- 68 11 96 % -- --  03/10/24 1545 (!) 141/80 -- -- 88 17 95 % -- --  03/10/24 1530 (!) 136/93 -- -- 77 18 100 % -- --  03/10/24 1515 112/87 -- -- 87 20 99 % -- --  03/10/24 1513 107/71 97.7 F (36.5 C) -- 94 18 98 % -- --  03/10/24 0855 (!) 181/106 -- -- 72 18 95 % -- --  03/10/24 0744 (!)  156/81 98.9 F (37.2 C) Oral 72 18 95 % 5' 1 (1.549 m) 98.9 kg  .  RECENT EKG RESULTS:    Orders placed or performed during the hospital encounter of 08/07/23   ED EKG   ED EKG    DISCHARGE INSTRUCTIONS:    DISCHARGE MEDICATIONS:   Allergies as of 03/11/2024       Reactions   Other Hives, Rash   Polyester        Medication List     TAKE these medications    acetaminophen  500 MG tablet Commonly known as: TYLENOL  Take 500-1,000 mg by mouth every 6 (six) hours as needed (pain.).   cyclobenzaprine 10 MG tablet Commonly known as: FLEXERIL Take 10 mg by mouth 3 (three) times daily as needed for muscle spasms.   diclofenac 75 MG EC tablet Commonly known as: VOLTAREN Take 75 mg by mouth 2 (two) times daily.   DULoxetine  60 MG capsule Commonly known as: CYMBALTA  Take 60 mg by mouth at bedtime.   fentaNYL  50 MCG/HR Commonly known as: DURAGESIC  Place 1 patch onto the skin every 3 (three) days.   HYDROmorphone  4 MG tablet Commonly known as: Dilaudid  Take 1 tablet (4 mg total) by mouth every 4 (four) hours as needed for severe pain (pain score 7-10) (discontinue oxycodone  while on dilaudid  for post op pain).   MAGNESIUM OXIDE PO Take 1-2 tablets by mouth at bedtime.   metoprolol  succinate 25 MG 24 hr tablet Commonly known as: TOPROL -XL Take 25 mg by mouth at bedtime.   multivitamin with minerals Tabs tablet Take 1 tablet by mouth at bedtime.   Oxycodone  HCl 10 MG Tabs Take 10 mg by mouth 4 (four) times daily as needed (pain).   rosuvastatin 10 MG tablet Commonly known as: CRESTOR Take 10 mg by mouth in the morning.   VITAMIN C PO Take 1 tablet by mouth daily.   VITAMIN D PO Take 1 capsule by mouth daily.        FOLLOW UP VISIT:    DISCHARGE TO: Home   DISCHARGE CONDITION:  Carmen Strickland for Dr. Franky Pointer 03/11/2024, 7:29 AM

## 2024-03-11 NOTE — Progress Notes (Signed)
 Discharge meds in a secure bag delivered to patient by this RN

## 2024-03-11 NOTE — Plan of Care (Signed)
   Problem: Education: Goal: Knowledge of General Education information will improve Description: Including pain rating scale, medication(s)/side effects and non-pharmacologic comfort measures Outcome: Progressing   Problem: Health Behavior/Discharge Planning: Goal: Ability to manage health-related needs will improve Outcome: Progressing   Problem: Clinical Measurements: Goal: Ability to maintain clinical measurements within normal limits will improve Outcome: Progressing Goal: Will remain free from infection Outcome: Progressing Goal: Diagnostic test results will improve Outcome: Progressing Goal: Respiratory complications will improve Outcome: Progressing Goal: Cardiovascular complication will be avoided Outcome: Progressing   Problem: Activity: Goal: Risk for activity intolerance will decrease Outcome: Progressing   Problem: Nutrition: Goal: Adequate nutrition will be maintained Outcome: Progressing   Problem: Coping: Goal: Level of anxiety will decrease Outcome: Progressing   Problem: Elimination: Goal: Will not experience complications related to bowel motility Outcome: Progressing Goal: Will not experience complications related to urinary retention Outcome: Progressing   Problem: Pain Managment: Goal: General experience of comfort will improve and/or be controlled Outcome: Progressing   Problem: Safety: Goal: Ability to remain free from injury will improve Outcome: Progressing   Problem: Skin Integrity: Goal: Risk for impaired skin integrity will decrease Outcome: Progressing   Problem: Education: Goal: Knowledge of the prescribed therapeutic regimen will improve Outcome: Progressing Goal: Understanding of activity limitations/precautions following surgery will improve Outcome: Progressing Goal: Individualized Educational Video(s) Outcome: Progressing   Problem: Activity: Goal: Ability to tolerate increased activity will improve Outcome: Progressing    Problem: Pain Management: Goal: Pain level will decrease with appropriate interventions Outcome: Progressing

## 2024-03-11 NOTE — Progress Notes (Signed)
 Medications have been delivered to patient, AVS given and patient escorted downstairs to husband for transportation.

## 2024-03-11 NOTE — Discharge Instructions (Signed)
 "   Carmen Strickland. Supple, M.D., F.A.A.O.S. Orthopaedic Surgery Specializing in Arthroscopic and Reconstructive Surgery of the Shoulder [phone] [address] [place], [place] [address] - Fax [phone]   POST-OP TOTAL SHOULDER REPLACEMENT INSTRUCTIONS  1. Follow up in the office for your first post-op appointment 10-14 days from the date of your surgery. If you do not already have a scheduled appointment, our office will contact you to schedule.  2. The bandage over your incision is waterproof. You may begin showering with this dressing on. You may leave this dressing on until first follow up appointment within 2 weeks. We prefer you leave this dressing in place until follow up however after 5-7 days if you are having itching or skin irritation and would like to remove it you may do so. Go slow and tug at the borders gently to break the bond the dressing has with the skin. At this point if there is no drainage it is okay to go without a bandage or you may cover it with a light guaze and tape. You can also expect significant bruising around your shoulder that will drift down your arm and into your chest wall. This is very normal and should resolve over several days.   3. Wear your sling/immobilizer at all times except to perform the exercises below or to occasionally let your arm dangle by your side to stretch your elbow. You also need to sleep in your sling immobilizer until instructed otherwise. It is ok to remove your sling if you are sitting in a controlled environment and allow your arm to rest in a position of comfort by your side or on your lap with pillows to give your neck and skin a break from the sling. You may remove it to allow arm to dangle by side to shower. If you are up walking around and when you go to sleep at night you need to wear it.  4. Range of motion to your elbow, wrist, and hand are encouraged 3-5 times daily. Exercise to your hand and fingers helps to reduce  swelling you may experience.   5. Prescriptions for a pain medication and a muscle relaxant are provided for you. It is recommended that if you are experiencing pain that you pain medication alone is not controlling, add the muscle relaxant along with the pain medication which can give additional pain relief. The first 1-2 days is generally the most severe of your pain and then should gradually decrease. As your pain lessens it is recommended that you decrease your use of the pain medications to an as needed basis' only and to always comply with the recommended dosages of the pain medications.  6. Pain medications can produce constipation along with their use. If you experience this, the use of an over the counter stool softener or laxative daily is recommended.   7. For additional questions or concerns, please do not hesitate to call the office. If after hours there is an answering service to forward your concerns to the physician or physician assistant on call.  8.Pain control following an exparel  block  To help control your post-operative pain you received a nerve block  performed with Exparel  which is a long acting anesthetic (numbing agent) which can provide pain relief and sensations of numbness (and relief of pain) in the operative shoulder and arm for up to 3 days. Sometimes it provides mixed relief, meaning you may still have numbness in certain areas of the arm but  can still be able to move  parts of that arm, hand, and fingers. We recommend that your prescribed pain medications  be used as needed. We do not feel it is necessary to pre medicate and stay ahead of pain.  Taking narcotic pain medications when you are not having any pain can lead to unnecessary and potentially dangerous side effects.    While the effects of the nerve block are present, please be aware that while you do not have sensation we recommend you pay careful attention to keep your arm positioned in a protective way.  Also if you have a sling that has a thumb loop that helps to keep the sling from sliding backwards, make sure you remove the loop to avoid a constant pressure to the thumb which can cause some nerve damage or skin breakdown.  9. Use the ice machine as much as possible in the first 5-7 days from surgery, then you can wean its use to as needed. The ice typically needs to be replaced every 6 hours, instead of ice you can actually freeze water  bottles to put in the cooler and then fill water  around them to avoid having to purchase ice. You can have spare water  bottles freezing to allow you to rotate them once they have melted. Try to have a thin shirt or light cloth or towel under the ice wrap to protect your skin.   FOR ADDITIONAL INFO ON ICE MACHINE AND INSTRUCTIONS GO TO THE WEBSITE AT  [email or web address]   10. Most people find it more comfortable to sleep in a semi upright position or in a recliner with the arm supported. Again, we do recommend you wear your sling to sleep. It is also ok to try to sleep lying flat in bed with a pillow behind your arm to keep it from sliding or falling backwards. If you are trying to sleep on the non-operative side, please use pillows to position your arm so that it does not slide forwards or backwards. It is very common that sleeping and getting into a comfortable position is difficult after shoulder surgery, this should improve with time.  11. Dressing - It is recommended you wear shirts that are loose or button up the front. To put shirt on allow operative arm to dangle by your side and slide the shirt on that arm, then your head and then the non-operative arm. To remove the shirt do this sequence in reverse. We recommend you wear the sling over top of your shirt to prevent skin irritation. If you notice irritation in your axilla (arm pit) please try to elevate arm away from your body to allow air to get in this area and  consider use of an over the counter ointment such as hydrocortisone or simple lotion.  12.  We recommend that you avoid any dental work or cleaning in the first 3 months following your joint replacement. This is to help minimize the possibility of infection from the bacteria in your mouth that enters your bloodstream during dental work. We also recommend that you take an antibiotic prior to your dental work for the first year after your shoulder replacement to further help reduce that risk. Please simply contact our office for antibiotics to be sent to your pharmacy prior to dental work.  13. Dental Antibiotics:  We recommend waiting at least 3 months for any dental work even cleanings unless there is a actuary. We also recommend  prophylactic antibiotics for all dental procdeures  the first year following your joint replacement. In some exceptions we recommend them to be used lifelong. We will provide you with that prescription in follow up office visits, or you can call our office.   POST-OP EXERCISES  No exercises for the shoulder yet, okay to allow arm to dangle and to use lightly for hygiene.  Otherwise keep right shoulder quiet by her side and use the sling.  "

## 2024-03-11 NOTE — Care Management Important Message (Signed)
 Important Message  Patient Details IM Letter given. Name: Carmen Strickland MRN: [id number] Date of Birth: 1965-10-12   Important Message Given:  Yes - Medicare IM     Melba Ates 03/11/2024, 11:49 AM

## 2024-03-11 NOTE — Progress Notes (Signed)
" °   03/11/24 1046  TOC Brief Assessment  Insurance and Status Reviewed  Patient has primary care physician Yes  Home environment has been reviewed home with spouse  Prior level of function: independent  Prior/Current Home Services No current home services  Social Drivers of Health Review SDOH reviewed no interventions necessary  Readmission risk has been reviewed Yes  Transition of care needs no transition of care needs at this time    "

## 2024-03-11 NOTE — Evaluation (Signed)
 Occupational Therapy Evaluation Patient Details Name: Carmen Strickland MRN: [id number] DOB: 09/27/1965 Today's Date: 03/11/2024   History of Present Illness   Carmen Strickland is a 58 y/o female s/p R Reverse TSA on 03/10/24. PMH: chronic back pain, L TSA, CTR, anxiety, OP     Clinical Impressions PTA pt lives with husband and was mod I with SPC prior to surgery. Husband bedside for education and training.  Education completed regarding compensatory strategies for ADL tasks and functional mobility, management of sling, R ROM per specified parameters in the order set as indicated below, positioning of operative arm in sitting and supine and edema control, including use of Iceman Cold Therapy machine. Caregiver present for education, written handouts provided and reviewed using Teach Back and pt/caregiver verbalized/demonstrated understanding. Due to the below listed deficits, pt requires min assistance with ADL tasks and CGA assist with functional mobility with SPC on level surfaces. Caregiver will be able to provide necessary level of assistance at discharge. Pt to follow up with MD to progress rehab of the operative shoulder. Gait belt issued and recommended for falls prevention.       If plan is discharge home, recommend the following:   A little help with walking and/or transfers;A little help with bathing/dressing/bathroom;Assistance with cooking/housework;Assist for transportation;Help with stairs or ramp for entrance     Functional Status Assessment   Patient has had a recent decline in their functional status and demonstrates the ability to make significant improvements in function in a reasonable and predictable amount of time.     Equipment Recommendations   None recommended by OT      Precautions/Restrictions   Precautions Precautions: Shoulder Shoulder FF 0-60; ER 0-20; Abd 0-45. ROM for ADL purposes only; AROM elbow/wrist/hand; OK for lap slides, NO pendulums;  Loosen sling from neck when arm is supported in sitting  Precaution Booklet Issued: Yes (comment) Required Braces or Orthoses: Sling Restrictions Weight Bearing Restrictions Per Provider Order: Yes RUE Weight Bearing Per Provider Order: Non weight bearing Other Position/Activity Restrictions: R UE     Mobility Bed Mobility Overal bed mobility: Modified Independent                  Transfers Overall transfer level: Modified independent                 General transfer comment: issued gait belt for stairs, mod I in room with sling in place and [facility]      Balance Overall balance assessment: Mild deficits observed, not formally tested (baseline)                                         ADL either performed or assessed with clinical judgement   ADL Overall ADL's : Needs assistance/impaired    Per orders, R shoulder parameters as follows for ADL tasks: Abd 0-45; ER 0-20; FF 0-60; While moving within specified parameters, pt/caregiver instructed on bathing and how to donn/doff shirt, placing operative arm through sleeve first when donning and off last when doffing.Pt/caregiver educated on compensatory strategies for LB ADL and strategies to reduce risk of falls.  Pt/caregiver educated on donning/doffing sling and to wear the sling at all times with the exception of ADL, and to loosen the neck strap of the sling when the operative arm is in a supported position when sitting. In sitting or supine, pt instructed to have a pillow  behind and under their operative arm to provide support. If assist needed with ambulation, caregiver educated on the importance of walking on pt's non-operative side.  Education regarding use of IceMan Cold Therapy completed, including the importance of using a barrier on the shoulder prior to positioning the wrap-on pad. Pt/caregiver verbalized/demonstrated understanding. Teach Back used while caregiver assisted with dressing pt and  positioning wrap-on pad to facilitate DC. Husband educated in use of gait belt on stairs.                                         Vision Baseline Vision/History: 1 Wears glasses;0 No visual deficits              Pertinent Vitals/Pain Pain Assessment Pain Assessment: No/denies pain     Extremity/Trunk Assessment Upper Extremity Assessment Upper Extremity Assessment: Right hand dominant;RUE deficits/detail RUE Deficits / Details: R post op nerve block remains somewhat active, AAROM R elbow, wrist, hand WFL RUE Coordination: decreased fine motor;decreased gross motor   Lower Extremity Assessment Lower Extremity Assessment: Overall WFL for tasks assessed (given baseline deficits)   Cervical / Trunk Assessment Cervical / Trunk Assessment: Normal   Communication Communication Communication: No apparent difficulties   Cognition Arousal: Alert Behavior During Therapy: WFL for tasks assessed/performed Cognition: No apparent impairments                               Following commands: Intact       Cueing  General Comments   Cueing Techniques: Verbal cues  R post op dressing in place, no SOB on RA   Exercises Exercises: Shoulder Shoulder Exercises Elbow Flexion: AAROM, 10 reps, Right Elbow Extension: AAROM, Right, 10 reps Wrist Flexion: AAROM, Right, 10 reps Wrist Extension: AAROM, 10 reps, Right Digit Composite Flexion: AAROM, Right, 10 reps Composite Extension: AAROM, Right, 10 reps   Shoulder Instructions Shoulder Instructions Donning/doffing shirt without moving shoulder: Caregiver independent with task;Minimal assistance Method for sponge bathing under operated UE: Caregiver independent with task;Minimal assistance Donning/doffing sling/immobilizer: Caregiver independent with task;Minimal assistance Correct positioning of sling/immobilizer: Caregiver independent with task;Minimal assistance ROM for elbow, wrist and digits of  operated UE: Minimal assistance;Caregiver independent with task Sling wearing schedule (on at all times/off for ADL's): Minimal assistance;Caregiver independent with task Proper positioning of operated UE when showering: Minimal assistance;Caregiver independent with task Positioning of UE while sleeping: Minimal assistance;Caregiver independent with task    Home Living Family/patient expects to be discharged to:: Private residence Living Arrangements: Spouse/significant other Available Help at Discharge: Family Type of Home: House Home Access: Stairs to enter Secretary/administrator of Steps: 2 STE with grab bar   Home Layout: One level     Bathroom Shower/Tub: Tub/shower unit;Walk-in shower   Bathroom Toilet: Handicapped height     Home Equipment: Cane - single Librarian, Academic (2 wheels);Shower seat;Lift chair   Additional Comments: husband present for training      Prior Functioning/Environment Prior Level of Function : Independent/Modified Independent             Mobility Comments: uses SPC ADLs Comments: mod I    OT Problem List: Impaired UE functional use    AM-PAC OT 6 Clicks Daily Activity     Outcome Measure Help from another person eating meals?: A Little Help from another person taking care of personal grooming?:  A Little Help from another person toileting, which includes using toliet, bedpan, or urinal?: A Little Help from another person bathing (including washing, rinsing, drying)?: A Little Help from another person to put on and taking off regular upper body clothing?: A Little Help from another person to put on and taking off regular lower body clothing?: A Little 6 Click Score: 18   End of Session Equipment Utilized During Treatment: Gait belt;Other (comment) (sling and cane) Nurse Communication: Mobility status;Other (comment) (completed all OT education for discharge)  Activity Tolerance: Patient tolerated treatment well Patient left: in  bed;with family/visitor present;with call bell/phone within reach  OT Visit Diagnosis: Unsteadiness on feet (R26.81);Other abnormalities of gait and mobility (R26.89)                Time: 1030-1130 OT Time Calculation (min): 60 min Charges:  OT General Charges $OT Visit: 1 Visit OT Evaluation $OT Eval Low Complexity: 1 Low OT Treatments $Self Care/Home Management : 8-22 mins $Therapeutic Activity: 8-22 mins $Therapeutic Exercise: 8-22 mins  Zadkiel Dragan OT/L Acute Rehabilitation Department  [phone]  03/11/2024, 11:58 AM

## 2024-03-24 LAB — AEROBIC/ANAEROBIC CULTURE W GRAM STAIN (SURGICAL/DEEP WOUND)
Culture: NO GROWTH
Culture: NO GROWTH
Culture: NO GROWTH
Gram Stain: NONE SEEN
Gram Stain: NONE SEEN
Gram Stain: NONE SEEN
